# Patient Record
Sex: Female | Born: 1960 | Race: White | Hispanic: No | Marital: Married
Health system: Midwestern US, Community
[De-identification: ages and names within clinical notes are randomized; demographics above are authoritative.]

---

## 2019-12-02 ENCOUNTER — Ambulatory Visit: Attending: Orthopaedic Surgery

## 2019-12-02 ENCOUNTER — Ambulatory Visit: Admit: 2019-12-02 | Discharge: 2019-12-02 | Payer: MEDICAID | Attending: Orthopaedic Surgery

## 2019-12-02 ENCOUNTER — Inpatient Hospital Stay: Admit: 2019-12-02 | Payer: MEDICAID | Attending: Orthopaedic Surgery

## 2019-12-02 DIAGNOSIS — R202 Paresthesia of skin: Secondary | ICD-10-CM

## 2019-12-02 NOTE — Progress Notes (Signed)
New Patient Visit    Date:  12/02/19    Name: Renee Weaver  MRN: [id number]      CC:   Chief Complaint   Patient presents with   ??? Hand Pain     Rt hand and elbow pain         HPI:    CLAUDETT BAYLY is a 59 y.o. female who states that in September of 2019 will working she develops symptoms in her right upper extremity.  At the time, hand with block into a clinched fist requiring her to pull the fingers open.  She also get a dorsal radiating pain up the forearm from the wrist into the elbows.  It within spread is an ache into the shoulder and the neck.  [facility] where they felt she did not have carpal tunnel and advised that she go to [facility].   She states [facility] sent her for massage therapy.  She states that they felt her symptoms were from the cervical spine.  Of note, she has a spinal fusion in 2004 with and extention of the fusion one level in 2007.  Ultimately, she was let go from work.  She has not work since.  She has also received no treatment sent she states.  She does get occasional numbness and tingling in her 4th and 5th digits.  Occasional numbness in the index finger.  She does awaken at night when she bumps the medial aspect her elbow in gets electrical shocks.  She does have some weakness.  She denies difficulty fine motor tasks although takes or longer to button things.  She does note that she drops things if she is not careful.  The patient's symptoms have less than but of not resolved since leaving work.  She does states that she has out currently on disability due to her cervical spine period    PMH:   Past Medical History:   Diagnosis Date   ??? Arthritis    ??? Chronic obstructive pulmonary disease (HCC)    ??? Chronic pain    ??? Depression    ??? Hypertension    ??? Thyroid disease        PSH:   Past Surgical History:   Procedure Laterality Date   ??? HX CESAREAN SECTION  1991   ??? HX OTHER SURGICAL  1980's    2 oral surgeries   ??? HX WISDOM TEETH  EXTRACTION  1980's   ??? PR PLASTIC SURGERY, NECK  2004/2007    2 neck fusions       Social History:   Social History     Socioeconomic History   ??? Marital status: MARRIED     Spouse name: Not on file   ??? Number of children: Not on file   ??? Years of education: Not on file   ??? Highest education level: Not on file   Occupational History   ??? Not on file   Tobacco Use   ??? Smoking status: Current Every Day Smoker     Packs/day: 0.50     Years: 30.00     Pack years: 15.00   ??? Smokeless tobacco: Never Used   Vaping Use   ??? Vaping Use: Never used   Substance and Sexual Activity   ??? Alcohol use: Yes   ??? Drug use: Yes     Types: Marijuana     Comment: medical   ??? Sexual activity: Not on file  Other Topics Concern   ??? Not on file   Social History Narrative   ??? Not on file     Social Determinants of Health     Financial Resource Strain:    ??? Difficulty of Paying Living Expenses:    Food Insecurity:    ??? Worried About Programme researcher, broadcasting/film/video in the Last Year:    ??? Barista in the Last Year:    Transportation Needs:    ??? Freight forwarder (Medical):    ??? Lack of Transportation (Non-Medical):    Physical Activity:    ??? Days of Exercise per Week:    ??? Minutes of Exercise per Session:    Stress:    ??? Feeling of Stress :    Social Connections:    ??? Frequency of Communication with Friends and Family:    ??? Frequency of Social Gatherings with Friends and Family:    ??? Attends Religious Services:    ??? Database administrator or Organizations:    ??? Attends Engineer, structural:    ??? Marital Status:    Intimate Programme researcher, broadcasting/film/video Violence:    ??? Fear of Current or Ex-Partner:    ??? Emotionally Abused:    ??? Physically Abused:    ??? Sexually Abused:        Allergies:   Allergies   Allergen Reactions   ??? Bactrim [Sulfamethoprim] Hives   ??? Macrodantin [Nitrofurantoin Macrocrystal] Hives       Medications:   Prior to Admission medications    Medication Sig Start Date End Date Taking? Authorizing Provider   DULoxetine (CYMBALTA) 60 mg capsule  TAKE 1 CAPSULE BY MOUTH ONCE DAILY FOR 7 DAYS 10/16/19  Yes Provider, Historical   levothyroxine (SYNTHROID) 125 mcg tablet TAKE 1 2 (ONE HALF) TABLET BY MOUTH ONCE DAILY 10/16/19  Yes Provider, Historical   simvastatin (ZOCOR) 10 mg tablet TAKE 1 TABLET BY MOUTH AT BEDTIME 10/16/19  Yes Provider, Historical   Spiriva with HandiHaler 18 mcg inhalation capsule INHALE 1 PUFF BY MOUTH ONCE DAILY 11/18/19  Yes Provider, Historical   albuterol sulfate (PROAIR DIGIHALER IN) Take  by inhalation.   Yes Provider, Historical   cholecalciferol, vitamin D3, (Vitamin D3) 50 mcg (2,000 unit) tab Take  by mouth.   Yes Provider, Historical   BABY ASPIRIN PO Take 81 mg by mouth.   Yes Provider, Historical       Problem List:   Patient Active Problem List   Diagnosis Code   ??? 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 Hand Pain     Rt hand and elbow pain         PHYSICAL EXAM:  Visit Vitals  Ht 5' 3.5" (1.613 m)   Wt 110 lb (49.9 kg)   BMI 19.18 kg/m??     @BW @        Body mass index is 19.18 kg/m??.    General:  The patient is a well-appearing 59 y.o. female who appears her  stated age. she is alert and oriented x3, pleasant, conversive, interactive and appropriate.    Skin  Intact, no abrasions or open wounds.     Inspection  No swelling or atrophy the hand is noted.    Palpation  Nontender the CMC joint, 1st compartment, or A1 pulleys.  Nontender the DRUJ, ECU, TFCC, pisotriquetral joint.  Tender over the lateral epicondyle and medial epicondyle.  Nontender over the biceps or triceps.  Tender over  the ulnar nerve at the elbow.  Nontender the radial tunnel.    Vascular  The hand is warm and well-perfused. 2+ radial and ulnar pulses. Capillary refill is less than two seconds.     Sensory  Thumb and little finger 2 point discrimination are 8 mm.     Motor  Thumb adductor is 5/5 without atrophy.  First dorsal interosseous is 4/5 without atrophy.    Specialized musculoskeletal examination:  Negative Spurling.  Negative Adson.  Positive Tinel at the elbow but negative elbow flexion test.  No subluxation of the ulnar nerve at the elbow.  Negative Tinel over the brachial plexus negative Tinel over the medial arm.  Negative Tinel over the pronator.  Compression of the pronator produces no paresthesias.  Negative Tinel over Guyon's but compression over Guyon's produces some paresthesias in the little finger.  Negative Tinel at the wrist but a positive median nerve compression test which produces paresthesias in the thumb index and middle finger.  Full elbow flexion and full elbow extension.  Full forearm pronation full forearm supination.  No pain with resisted wrist extension, resisted forearm supination, or resisted forearm pronation.  No clicking or locking flexion-extension digits.  Negative CMC grind.  Negative Finkelstein.  Compression rotation DRUJ produces no pain.  Her ECU is stable.  Negative TFCC grind  ______________________________________________________________________    IMAGING:  X-ray examination includes two views of the right elbow, including AP and lateral views, obtained today, December 02, 2019, at [facility].  These reveal a spur at the origin of the ECRB tendon laterally.    X-ray examination includes three views of the right wrist, including AP, lateral, and carpal tunnel views, obtained today, December 02, 2019, at [facility].  These reveal joint space narrowing of the [facility] and STT joints.  There is ulnar positivity with abutment of the ulnar head on the  lunate.  There is some cystic changes and sclerosis within the lunate.  _____________________________________________________________________    IMPRESSION:  Encounter Diagnoses     ICD-10-CM ICD-9-CM   1. Paresthesias in right hand  R20.2 782.0   2. Carpal tunnel syndrome on right  G56.01 354.0   3. Cubital tunnel syndrome on right  G56.21 354.2   4. Medial epicondylitis, right elbow  M77.01 726.31   5. Lateral epicondylitis of right elbow  M77.11 726.32        PLAN:  Today we discussed the diagnosis and treatment options.  The patient's primary problem does not appear to be her neck today.  She does have some evidence of irritation median nerve in the ulnar nerve.  Additionally, she has some evidence of some mild medial and lateral epicondylitis.  At this point, I would like to proceed with an EMG to rule out cubital tunnel syndrome as well as carpal tunnel syndrome.  Will see her back after the EMG.    Based on her oral history of her symptoms beginning at work, did not improve with treatment or time off from work,  and she has been working nor else, her current symptoms are related to her initial work injury.      FOLLOW-UP:  After EMG.    IMAGING NEXT VISIT: no

## 2019-12-04 IMAGING — MR MRI RIGHT SHOULDER WITHOUT CONTRAST
5 of 7 series · 24 of 40 positions shown · IV contrast (gadolinium)
Comparison: None.

MRI RIGHT SHOULDER WITHOUT CONTRAST, 12/04/2019 [DATE]: 
CLINICAL INDICATION: Primary osteoarthritis. Right shoulder pain and 
impingement. Short-term relief with cortisone injection.
TECHNIQUE: Multiplanar, multiecho position MR images of the right shoulder were 
performed without intravenous gadolinium enhancement.

[Series 101: survey_fullfov_transversal · axial · 10.0mm · 1.84mm/px · z∈[-82,+82]mm · 2 of 12 slices shown]
[im 1/12]
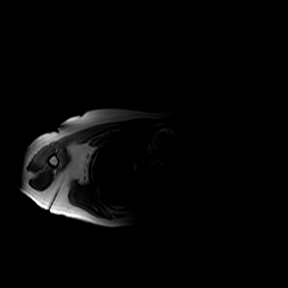
[im 12/12]
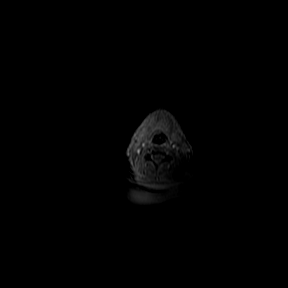

[Series 201: survey_right · axial · 10.0mm · 0.99mm/px · z∈[-40,+175]mm · 3 of 15 slices shown]
[im 1/15]
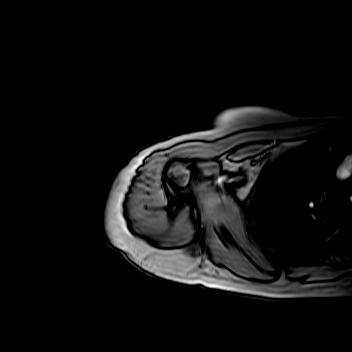
[im 8/15]
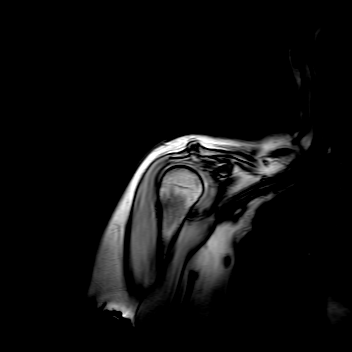
[im 15/15]
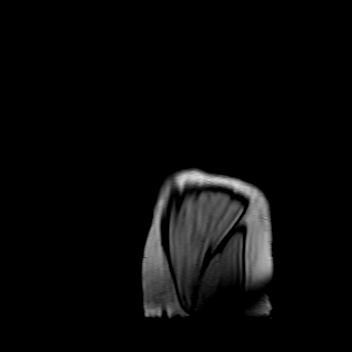

[Series 301: pdw spair ax · axial · 2.2mm · 0.27mm/px · z∈[-39,+53]mm · 9 of 36 slices shown]
[im 1/36]
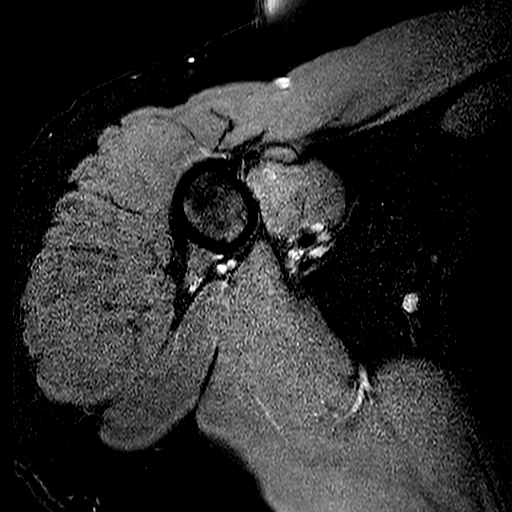
[im 5/36]
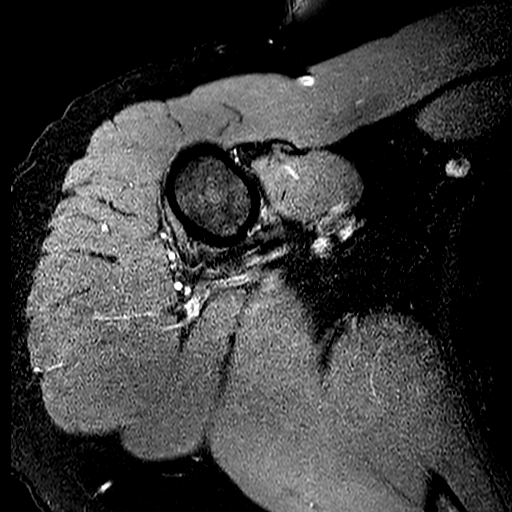
[im 9/36]
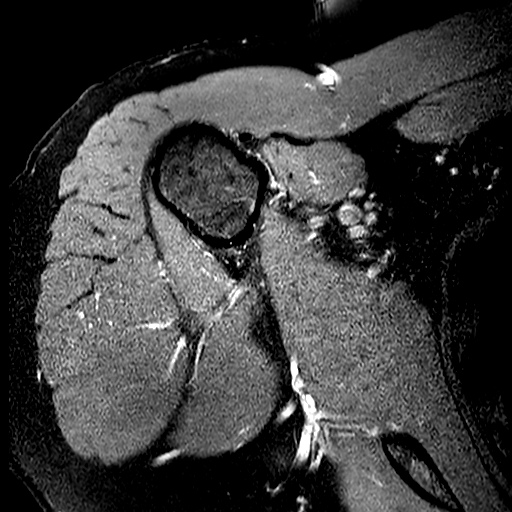
[im 14/36]
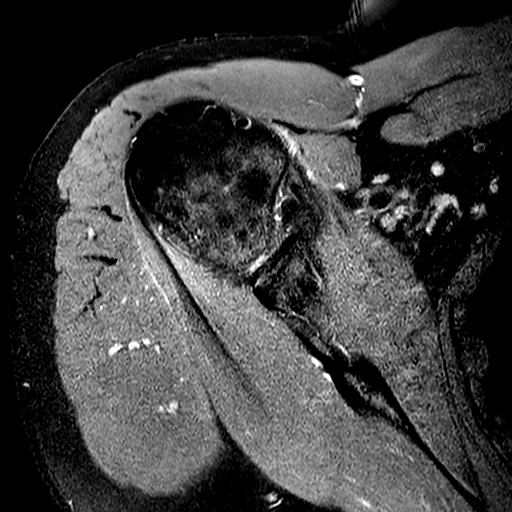
[im 18/36]
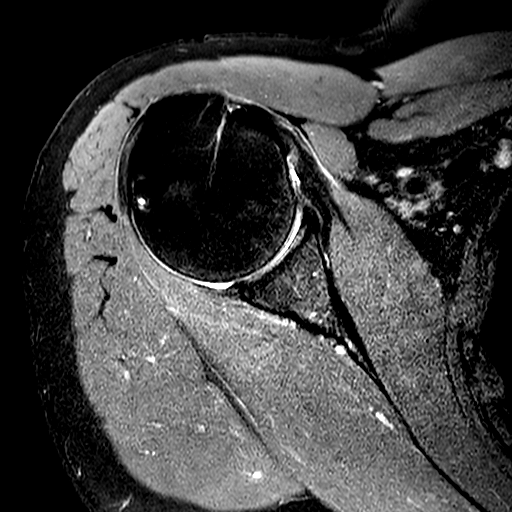
[im 22/36]
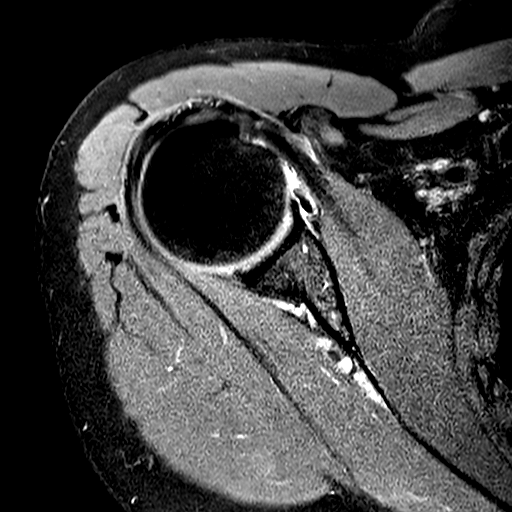
[im 27/36]
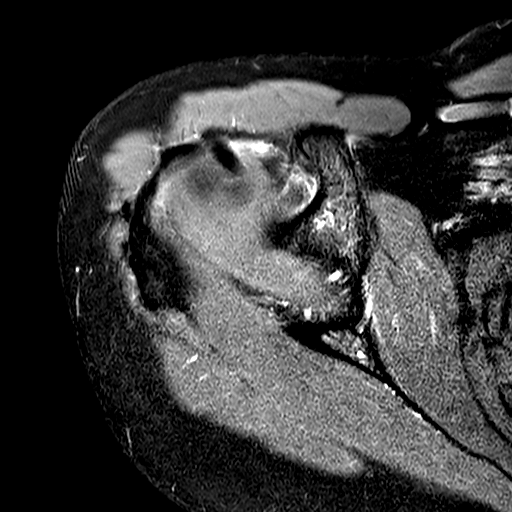
[im 31/36]
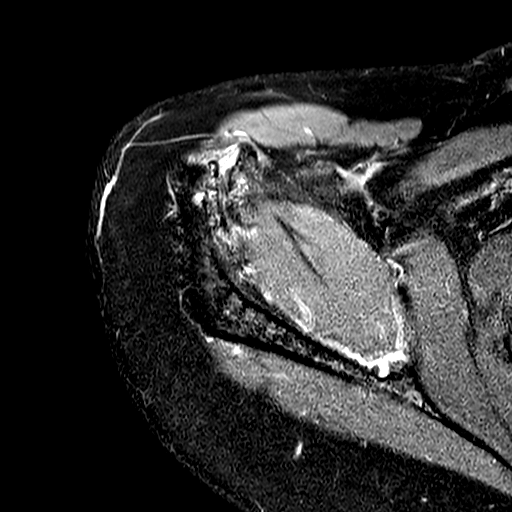
[im 36/36]
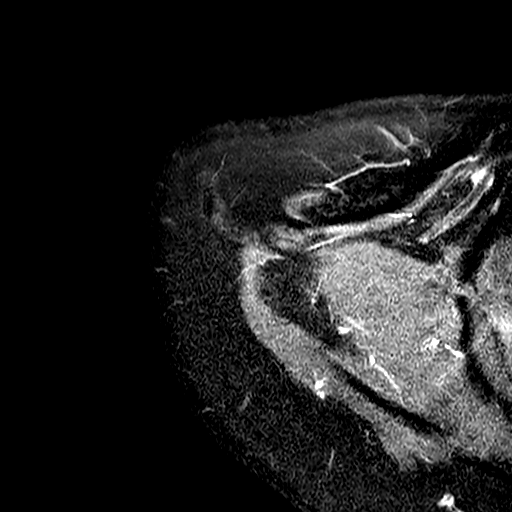

[Series 401: t2w spair sag · oblique · 3.2mm · 0.39mm/px · 7 of 28 slices shown]
[im 1/28]
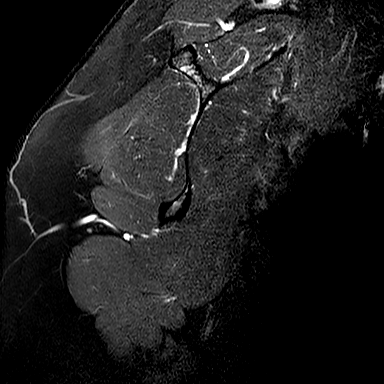
[im 5/28]
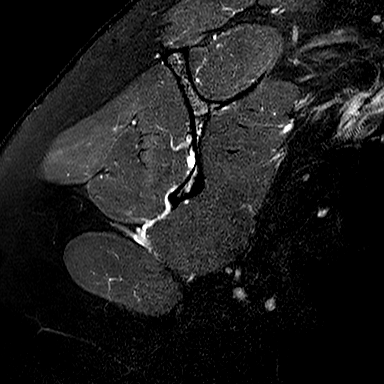
[im 10/28]
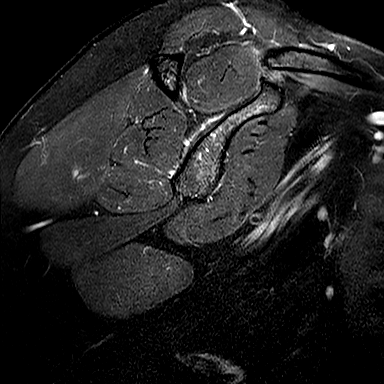
[im 14/28]
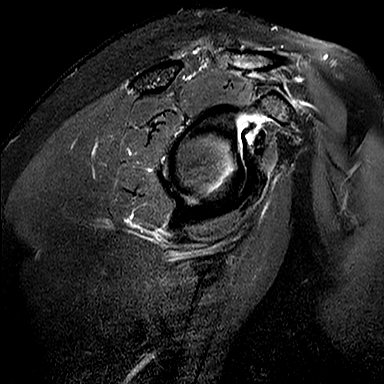
[im 19/28]
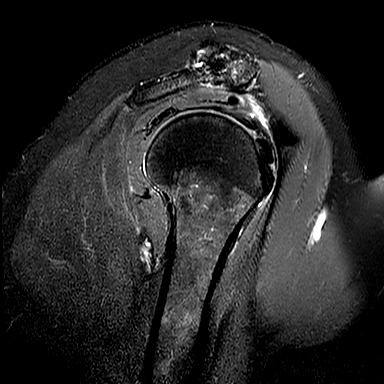
[im 23/28]
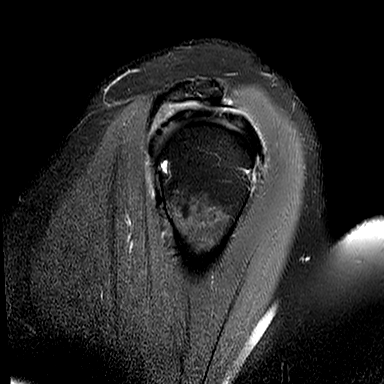
[im 28/28]
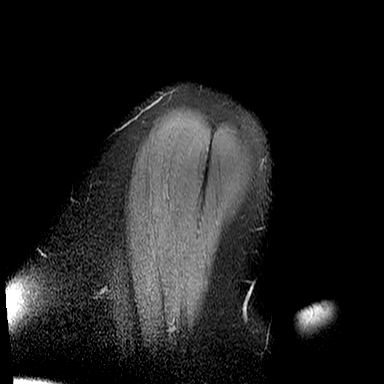

[Series 601: t1w tse sag · oblique · 3.2mm · 0.29mm/px · 3 of 28 slices shown]
[im 1/28]
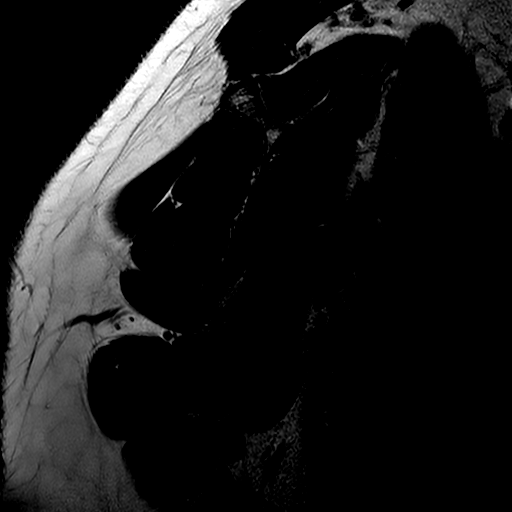
[im 5/28]
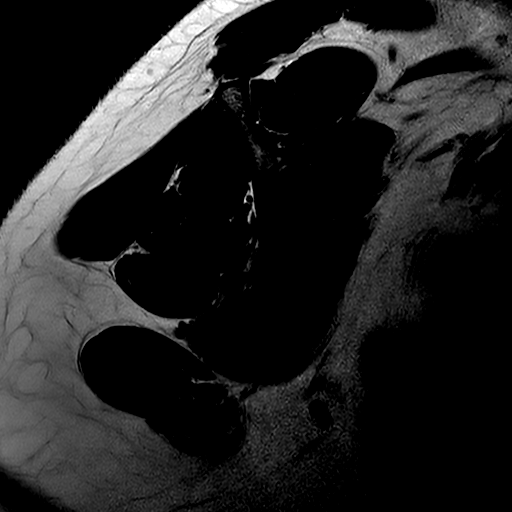
[im 10/28]
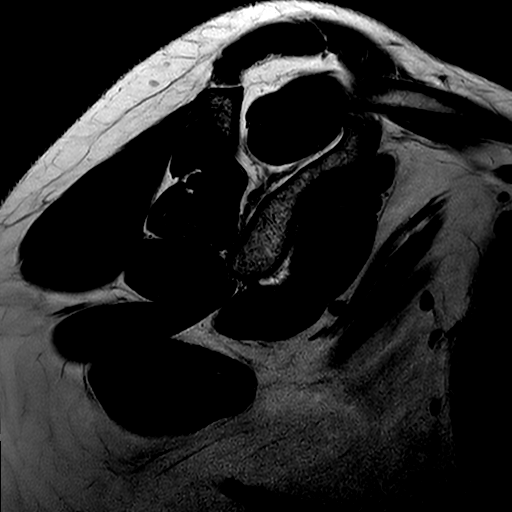

[24 of 40 positions shown; findings below may reference images not displayed]

FINDINGS: ROTATOR CUFF: The supraspinatus and infraspinatus tendons are intact. No 
high-grade partial thickness or full-thickness rotator cuff tear. The 
subscapularis and teres minor tendons are preserved. The rotator cuff 
musculature is symmetric without mass, signal abnormality or atrophy. 
ACROMIOCLAVICULAR JOINT: Moderate to severe AC joint degenerative change with 
focal effacement of the subacromial fat. The coracoacromial ligament is intact 
without prominent spurring at the acromial attachment. The acromioclavicular and 
coracoclavicular ligaments are preserved. The acromion is normal in morphology. 
GLENOHUMERAL JOINT: The humeral head is well located within the glenoid fossa. 
The glenoid labrum is preserved. No paralabral cyst. The intra-articular portion 
of the long head of the biceps tendon is negative. No shoulder joint effusion. 
BONES AND SOFT TISSUES: The bone marrow signal intensity is negative for 
fracture. No Hill-Sachs defect. Small posterolateral humeral head subcortical 
cysts. Trace amount of fluid in the subacromial/subdeltoid bursa. The axillary 
region is negative. Subcutaneous tissues are negative.
IMPRESSION: 1.  Moderate to severe AC joint degenerative change with focal effacement of the 
subacromial fat and trace amount of bursal fluid. 
2.  Small posterolateral humeral head subcortical cysts. 
3.  Otherwise, unremarkable.

## 2020-02-03 ENCOUNTER — Ambulatory Visit: Attending: Orthopaedic Surgery

## 2020-02-03 ENCOUNTER — Ambulatory Visit: Admit: 2020-02-03 | Discharge: 2020-02-03 | Payer: MEDICAID | Attending: Orthopaedic Surgery

## 2020-02-03 DIAGNOSIS — M7701 Medial epicondylitis, right elbow: Secondary | ICD-10-CM

## 2020-02-03 NOTE — Progress Notes (Signed)
FOLLOW-UP VISIT  ______________________________________________________________________    Name: Renee Weaver  DOB: 1960-06-19  Today's Date: 02/03/2020  Last Visit: 12/02/2019    Chief Complaint/Diagnosis:  Chief Complaint   Patient presents with   ??? Wrist Pain     Follow up EMG   ??? Follow-up     ______________________________________________________________________    HPI:  Renee Weaver is a very pleasant 59 y.o. female who presents with the above listed complaint.  Patient returns having the EMG as requested.  The EMG was negative for obvious compressive neuropathy.  We reviewed that there could still be irritation of the nerves without frank compression causing damage, which is what the nerve test would detect.  She still having mild lateral and more intense medial-sided elbow pain.  She is still getting some paresthesias in the hand.  She notes tenderness along the cubital tunnel.  ______________________________________________________________________    PMHx:   Past Medical History:   Diagnosis Date   ??? Arthritis    ??? Chronic obstructive pulmonary disease (HCC)    ??? Chronic pain    ??? Depression    ??? Hypertension    ??? Thyroid disease        PSgHx:   Past Surgical History:   Procedure Laterality Date   ??? HX CESAREAN SECTION  1991   ??? HX OTHER SURGICAL  1980's    2 oral surgeries   ??? HX WISDOM TEETH EXTRACTION  1980's   ??? PR PLASTIC SURGERY, NECK  2004/2007    2 neck fusions       ALLERGIES:   Allergies   Allergen Reactions   ??? Bactrim [Sulfamethoprim] Hives   ??? Macrodantin [Nitrofurantoin Macrocrystal] Hives       MEDS:   Current Outpatient Medications:   ???  citalopram (CELEXA) 40 mg tablet, citalopram 40 mg tablet, Disp: , Rfl:   ???  albuterol (ProAir HFA) 90 mcg/actuation inhaler, ProAir HFA 90 mcg/actuation aerosol inhaler, Disp: , Rfl:   ???  DULoxetine (CYMBALTA) 60 mg capsule, TAKE 1 CAPSULE BY MOUTH ONCE DAILY FOR 7 DAYS, Disp: , Rfl:   ???  levothyroxine (SYNTHROID) 125 mcg tablet, TAKE 1 2 (ONE HALF)  TABLET BY MOUTH ONCE DAILY, Disp: , Rfl:   ???  simvastatin (ZOCOR) 10 mg tablet, TAKE 1 TABLET BY MOUTH AT BEDTIME, Disp: , Rfl:   ???  Spiriva with HandiHaler 18 mcg inhalation capsule, INHALE 1 PUFF BY MOUTH ONCE DAILY, Disp: , Rfl:   ???  albuterol sulfate (PROAIR DIGIHALER IN), Take  by inhalation., Disp: , Rfl:   ???  cholecalciferol, vitamin D3, (Vitamin D3) 50 mcg (2,000 unit) tab, Take  by mouth., Disp: , Rfl:   ???  BABY ASPIRIN PO, Take 81 mg by mouth., Disp: , Rfl:     SHx:   Social History     Socioeconomic History   ??? Marital status: MARRIED     Spouse name: Not on file   ??? Number of children: Not on file   ??? Years of education: Not on file   ??? Highest education level: Not on file   Occupational History   ??? Not on file   Tobacco Use   ??? Smoking status: Current Every Day Smoker     Packs/day: 0.50     Years: 30.00     Pack years: 15.00   ??? Smokeless tobacco: Never Used   Vaping Use   ??? Vaping Use: Never used   Substance and Sexual Activity   ???  Alcohol use: Yes   ??? Drug use: Yes     Types: Marijuana     Comment: medical   ??? Sexual activity: Not on file   Other Topics Concern   ??? Not on file   Social History Narrative   ??? Not on file     Social Determinants of Health     Financial Resource Strain:    ??? Difficulty of Paying Living Expenses:    Food Insecurity:    ??? Worried About Programme researcher, broadcasting/film/video in the Last Year:    ??? Barista in the Last Year:    Transportation Needs:    ??? Freight forwarder (Medical):    ??? Lack of Transportation (Non-Medical):    Physical Activity:    ??? Days of Exercise per Week:    ??? Minutes of Exercise per Session:    Stress:    ??? Feeling of Stress :    Social Connections:    ??? Frequency of Communication with Friends and Family:    ??? Frequency of Social Gatherings with Friends and Family:    ??? Attends Religious Services:    ??? Database administrator or Organizations:    ??? Attends Engineer, structural:    ??? Marital Status:    Intimate Programme researcher, broadcasting/film/video Violence:    ??? Fear of Current  or Ex-Partner:    ??? Emotionally Abused:    ??? Physically Abused:    ??? Sexually Abused:        FHx:   Family History   Problem Relation Age of Onset   ??? Dementia Mother    ??? Cancer Brother    ??? Alcohol abuse Maternal Uncle    ??? Cancer Maternal Grandmother    ??? Hypertension Maternal Grandfather    ??? Cancer Paternal Grandmother      ______________________________________________________________________    ROS  General: no fevers, night sweats, unexplained weight loss  Psychiatric: no suicidal ideation  Neurologic: no headaches, loss of consciousness or change in vision  Cardiovascular: no chest pain  Respiratory: no shortness of breath  GI: no abdominal pain, no ulcers  Skin: no infections, no rashes  Hematologic: no bleeding diatheses  Musckuloskeletal:   Chief Complaint   Patient presents with   ??? Wrist Pain     Follow up EMG   ??? Follow-up     ______________________________________________________________________    PHYSICAL EXAM:  Visit Vitals  BP (!) 169/114 (BP 1 Location: Right upper arm, BP Patient Position: Sitting, BP Cuff Size: Adult)   Pulse 81   Temp 97.6 ??F (36.4 ??C)   Ht 5' 3.5" (1.613 m)   Wt 110 lb (49.9 kg)   BMI 19.18 kg/m??     @PATIENTWT @  Body mass index is 19.18 kg/m??.    General  The patient is a well-appearing 59 y.o. female who appears her stated age. she is alert and oriented x3, pleasant, conversive, interactive and appropriate.    Skin  Intact, no abrasions or open wounds.     Inspection  No swelling of the arm is noted.    Palpation  Minimally tender lateral epicondyle.  Nontender over the biceps, triceps, or radial tunnel.  Tender over the medial epicondyle.  She is tender over the ulnar nerve within the groove.    Vascular  The hand is warm and well-perfused. 2+ radial and ulnar pulses. Capillary refill is brisk to all digits.     Sensory  Sensation is intact to  light touch in all nerve distributions.     Motor  5/5 strength throughout.     Specialized musculoskeletal examination:  Positive  Tinel at the elbow but negative elbow flexion test.  Negative Tinel at the wrist.  Median nerve compression test produces a ???strange feeling.???  No pain with resisted wrist extension or resisted forearm supination.  Some pain with resisted forearm pronation.  ______________________________________________________________________  Nerve conduction study:  Negative for compressive neuropathy.  ______________________________________________________________________    IMPRESSION:  Encounter Diagnoses     ICD-10-CM ICD-9-CM   1. Medial epicondylitis, right elbow  M77.01 726.31   2. Lateral epicondylitis of right elbow  M77.11 726.32   3. 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  Will have her attend therapy for the medial lateral epicondylitis as well as the ulnar neuritis.  I have given her a sheet on ulnar nerve glides, so that she can start them today.  Will see her back in 6 weeks after hopefully 4 weeks of therapy.  If not improving with therapy, we may need E consideration to a corticosteroid injection into the more symptomatic medial condyle.  This will hopefully also help with the ulnar neuritis.  _    FOLLOW-UP: in 6 week(s)    IMAGING NEXT VISIT: no  _____________________________________________________________________  1:25 PM, 02/03/2020

## 2020-03-15 ENCOUNTER — Ambulatory Visit: Attending: Orthopaedic Surgery

## 2020-03-15 ENCOUNTER — Ambulatory Visit: Admit: 2020-03-15 | Discharge: 2020-03-15 | Payer: MEDICAID | Attending: Orthopaedic Surgery

## 2020-03-15 DIAGNOSIS — M7701 Medial epicondylitis, right elbow: Secondary | ICD-10-CM

## 2020-03-15 NOTE — Progress Notes (Signed)
FOLLOW-UP VISIT  ______________________________________________________________________    Name: Renee Weaver  DOB: Aug 02, 1960  Today's Date: 03/15/2020  Last Visit: 02/03/2020    Chief Complaint/Diagnosis:  Chief Complaint   Patient presents with   ??? Hand Pain     6 wk follow up Rt hand/elbow     ______________________________________________________________________    HPI:  Renee Weaver is a very pleasant 59 y.o. female who presents with the above listed complaint.  Patient returns to [place] having had some therapy for her right medial and lateral epicondylitis.  She notes improvement in her symptoms but not complete resolution.  She has no longer having paresthesias.  She states she has a hearing in the near future with regards to her workman's comp status.  She also caring for her mother who has dementia and her father who has cancer period  ______________________________________________________________________    PMHx:   Past Medical History:   Diagnosis Date   ??? Arthritis    ??? Chronic obstructive pulmonary disease (HCC)    ??? Chronic pain    ??? Depression    ??? Hypertension    ??? Thyroid disease        PSgHx:   Past Surgical History:   Procedure Laterality Date   ??? HX CESAREAN SECTION  1991   ??? HX OTHER SURGICAL  1980's    2 oral surgeries   ??? HX WISDOM TEETH EXTRACTION  1980's   ??? PR PLASTIC SURGERY, NECK  2004/2007    2 neck fusions       ALLERGIES:   Allergies   Allergen Reactions   ??? Bactrim [Sulfamethoprim] Hives   ??? Macrodantin [Nitrofurantoin Macrocrystal] Hives       MEDS:   Current Outpatient Medications:   ???  lisinopriL (PRINIVIL, ZESTRIL) 5 mg tablet, Take 5 mg by mouth daily., Disp: , Rfl:   ???  citalopram (CELEXA) 40 mg tablet, citalopram 40 mg tablet, Disp: , Rfl:   ???  albuterol (ProAir HFA) 90 mcg/actuation inhaler, ProAir HFA 90 mcg/actuation aerosol inhaler, Disp: , Rfl:   ???  DULoxetine (CYMBALTA) 60 mg capsule, TAKE 1 CAPSULE BY MOUTH ONCE DAILY FOR 7 DAYS, Disp: , Rfl:   ???  levothyroxine  (SYNTHROID) 125 mcg tablet, TAKE 1 2 (ONE HALF) TABLET BY MOUTH ONCE DAILY, Disp: , Rfl:   ???  simvastatin (ZOCOR) 10 mg tablet, TAKE 1 TABLET BY MOUTH AT BEDTIME, Disp: , Rfl:   ???  Spiriva with HandiHaler 18 mcg inhalation capsule, INHALE 1 PUFF BY MOUTH ONCE DAILY, Disp: , Rfl:   ???  albuterol sulfate (PROAIR DIGIHALER IN), Take  by inhalation., Disp: , Rfl:   ???  cholecalciferol, vitamin D3, (Vitamin D3) 50 mcg (2,000 unit) tab, Take  by mouth., Disp: , Rfl:   ???  BABY ASPIRIN PO, Take 81 mg by mouth., Disp: , Rfl:     SHx:   Social History     Socioeconomic History   ??? Marital status: MARRIED     Spouse name: Not on file   ??? Number of children: Not on file   ??? Years of education: Not on file   ??? Highest education level: Not on file   Occupational History   ??? Not on file   Tobacco Use   ??? Smoking status: Current Every Day Smoker     Packs/day: 0.50     Years: 30.00     Pack years: 15.00   ??? Smokeless tobacco: Never Used   Vaping Use   ???  Vaping Use: Never used   Substance and Sexual Activity   ??? Alcohol use: Yes   ??? Drug use: Yes     Types: Marijuana     Comment: medical   ??? Sexual activity: Not on file   Other Topics Concern   ??? Not on file   Social History Narrative   ??? Not on file     Social Determinants of Health     Financial Resource Strain:    ??? Difficulty of Paying Living Expenses:    Food Insecurity:    ??? Worried About Programme researcher, broadcasting/film/video in the Last Year:    ??? Barista in the Last Year:    Transportation Needs:    ??? Freight forwarder (Medical):    ??? Lack of Transportation (Non-Medical):    Physical Activity:    ??? Days of Exercise per Week:    ??? Minutes of Exercise per Session:    Stress:    ??? Feeling of Stress :    Social Connections:    ??? Frequency of Communication with Friends and Family:    ??? Frequency of Social Gatherings with Friends and Family:    ??? Attends Religious Services:    ??? Database administrator or Organizations:    ??? Attends Engineer, structural:    ??? Marital Status:     Intimate Programme researcher, broadcasting/film/video Violence:    ??? Fear of Current or Ex-Partner:    ??? Emotionally Abused:    ??? Physically Abused:    ??? Sexually Abused:        FHx:   Family History   Problem Relation Age of Onset   ??? Dementia Mother    ??? Cancer Brother    ??? Alcohol abuse Maternal Uncle    ??? Cancer Maternal Grandmother    ??? Hypertension Maternal Grandfather    ??? Cancer Paternal Grandmother      ______________________________________________________________________    ROS  General: no fevers, night sweats, unexplained weight loss  Psychiatric: no suicidal ideation  Neurologic: no headaches, loss of consciousness or change in vision  Cardiovascular: no chest pain  Respiratory: no shortness of breath  GI: no abdominal pain, no ulcers  Skin: no infections, no rashes  Hematologic: no bleeding diatheses  Musckuloskeletal:   Chief Complaint   Patient presents with   ??? Hand Pain     6 wk follow up Rt hand/elbow     ______________________________________________________________________    PHYSICAL EXAM:  Visit Vitals  BP (!) 146/99   Pulse 78   Temp 98.6 ??F (37 ??C)   Ht 5' 3.5" (1.613 m)   Wt 110 lb (49.9 kg)   BMI 19.18 kg/m??     @PATIENTWT @  Body mass index is 19.18 kg/m??.    General  The patient is a well-appearing 59 y.o. female who appears her stated age. she is alert and oriented x3, pleasant, conversive, interactive and appropriate.    Skin  Intact, no abrasions or open wounds.     Inspection  No significant swelling the elbow is noted.    Palpation  Focally tender over the medial and lateral epicondyle.  Diffusely tender over the dorsal radial musculature.  Nontender the biceps or triceps.  Nontender the ulnar nerve.    Vascular  The hand is warm and well-perfused. 2+ radial and ulnar pulses. Capillary refill is brisk to all digits.     Sensory  Little finger 2 point discrimination is 6 mm.  Motor  First dorsal osseous is 5/5 without atrophy.    Specialized musculoskeletal examination:  Negative Tinel at the elbow and negative  elbow flexion test.  She has full elbow flexion and full elbow extension.  She has full forearm pronation full forearm supination.  No pain with resisted wrist extension over the lateral epicondyle, no pain with resisted forearm supination over the radial tunnel, and no pain with resisted forearm pronation over will epicondyle period  ______________________________________________________________________    IMPRESSION:  Encounter Diagnoses     ICD-10-CM ICD-9-CM   1. Medial epicondylitis, right elbow  M77.01 726.31   2. Lateral epicondylitis of right elbow  M77.11 726.32     ______________________________________________________________________    PLAN:  Her cubital tunnel syndrome has resolved.  We discussed continuing the current course.  She is having improvement overall in her medial lateral epicondyle pain period we discussed possible corticosteroid injections, possible PRP injection, possible Tenex procedure, and possible open debridement.  Will see her back after completion of her therapy for repeat clinical examination.  _    FOLLOW-UP: in 6 week(s)    IMAGING NEXT VISIT: no  _____________________________________________________________________  10:51 AM, 03/15/2020

## 2020-04-26 ENCOUNTER — Ambulatory Visit: Attending: Orthopaedic Surgery

## 2020-04-26 ENCOUNTER — Ambulatory Visit: Admit: 2020-04-26 | Discharge: 2020-04-26 | Payer: MEDICAID | Attending: Orthopaedic Surgery

## 2020-04-26 DIAGNOSIS — M7701 Medial epicondylitis, right elbow: Secondary | ICD-10-CM

## 2020-04-26 NOTE — Progress Notes (Signed)
FOLLOW-UP VISIT  ______________________________________________________________________    Name: Renee Weaver  DOB: Jun 24, 1960  Today's Date: 04/26/2020  Last Visit: 03/15/2020    Chief Complaint/Diagnosis:  Chief Complaint   Patient presents with   ??? Hand Pain      6 wk f/u Right hand/elbow pain      ______________________________________________________________________    HPI:  Renee Weaver is a very pleasant 59 y.o. female who presents with the above listed complaint.  The patient attended therapy as requested and has noted improvement in her elbow symptoms.  She is not currently having paresthesias in the hand.  She did have of meeting and has to have an additional median from a judge with respect to whether not this is a work related issue.  ______________________________________________________________________    PMHx:   Past Medical History:   Diagnosis Date   ??? Arthritis    ??? Chronic obstructive pulmonary disease (HCC)    ??? Chronic pain    ??? Depression    ??? Hypertension    ??? Thyroid disease        PSgHx:   Past Surgical History:   Procedure Laterality Date   ??? HX CESAREAN SECTION  1991   ??? HX OTHER SURGICAL  1980's    2 oral surgeries   ??? HX WISDOM TEETH EXTRACTION  1980's   ??? PR PLASTIC SURGERY, NECK  2004/2007    2 neck fusions       ALLERGIES:   Allergies   Allergen Reactions   ??? Bactrim [Sulfamethoprim] Hives   ??? Macrodantin [Nitrofurantoin Macrocrystal] Hives       MEDS:   Current Outpatient Medications:   ???  lisinopriL (PRINIVIL, ZESTRIL) 5 mg tablet, Take 5 mg by mouth daily., Disp: , Rfl:   ???  citalopram (CELEXA) 40 mg tablet, citalopram 40 mg tablet, Disp: , Rfl:   ???  albuterol (ProAir HFA) 90 mcg/actuation inhaler, ProAir HFA 90 mcg/actuation aerosol inhaler, Disp: , Rfl:   ???  DULoxetine (CYMBALTA) 60 mg capsule, TAKE 1 CAPSULE BY MOUTH ONCE DAILY FOR 7 DAYS, Disp: , Rfl:   ???  levothyroxine (SYNTHROID) 125 mcg tablet, TAKE 1 2 (ONE HALF) TABLET BY MOUTH ONCE DAILY, Disp: , Rfl:   ???   simvastatin (ZOCOR) 10 mg tablet, TAKE 1 TABLET BY MOUTH AT BEDTIME, Disp: , Rfl:   ???  Spiriva with HandiHaler 18 mcg inhalation capsule, INHALE 1 PUFF BY MOUTH ONCE DAILY, Disp: , Rfl:   ???  albuterol sulfate (PROAIR DIGIHALER IN), Take  by inhalation., Disp: , Rfl:   ???  cholecalciferol, vitamin D3, (Vitamin D3) 50 mcg (2,000 unit) tab, Take  by mouth., Disp: , Rfl:   ???  BABY ASPIRIN PO, Take 81 mg by mouth., Disp: , Rfl:     SHx:   Social History     Socioeconomic History   ??? Marital status: MARRIED     Spouse name: Not on file   ??? Number of children: Not on file   ??? Years of education: Not on file   ??? Highest education level: Not on file   Occupational History   ??? Not on file   Tobacco Use   ??? Smoking status: Current Every Day Smoker     Packs/day: 0.50     Years: 30.00     Pack years: 15.00   ??? Smokeless tobacco: Never Used   Vaping Use   ??? Vaping Use: Never used   Substance and Sexual Activity   ???  Alcohol use: Yes   ??? Drug use: Yes     Types: Marijuana     Comment: medical   ??? Sexual activity: Not on file   Other Topics Concern   ??? Not on file   Social History Narrative   ??? Not on file     Social Determinants of Health     Financial Resource Strain:    ??? Difficulty of Paying Living Expenses: Not on file   Food Insecurity:    ??? Worried About Running Out of Food in the Last Year: Not on file   ??? Ran Out of Food in the Last Year: Not on file   Transportation Needs:    ??? Lack of Transportation (Medical): Not on file   ??? Lack of Transportation (Non-Medical): Not on file   Physical Activity:    ??? Days of Exercise per Week: Not on file   ??? Minutes of Exercise per Session: Not on file   Stress:    ??? Feeling of Stress : Not on file   Social Connections:    ??? Frequency of Communication with Friends and Family: Not on file   ??? Frequency of Social Gatherings with Friends and Family: Not on file   ??? Attends Religious Services: Not on file   ??? Active Member of Clubs or Organizations: Not on file   ??? Attends Tax inspector Meetings: Not on file   ??? Marital Status: Not on file   Intimate Partner Violence:    ??? Fear of Current or Ex-Partner: Not on file   ??? Emotionally Abused: Not on file   ??? Physically Abused: Not on file   ??? Sexually Abused: Not on file   Housing Stability:    ??? Unable to Pay for Housing in the Last Year: Not on file   ??? Number of Places Lived in the Last Year: Not on file   ??? Unstable Housing in the Last Year: Not on file       FHx:   Family History   Problem Relation Age of Onset   ??? Dementia Mother    ??? Cancer Brother    ??? Alcohol abuse Maternal Uncle    ??? Cancer Maternal Grandmother    ??? Hypertension Maternal Grandfather    ??? Cancer Paternal Grandmother      ______________________________________________________________________    ROS  General: no fevers, night sweats, unexplained weight loss  Psychiatric: no suicidal ideation  Neurologic: no headaches, loss of consciousness or change in vision  Cardiovascular: no chest pain  Respiratory: no shortness of breath  GI: no abdominal pain, no ulcers  Skin: no infections, no rashes  Hematologic: no bleeding diatheses  Musckuloskeletal:   Chief Complaint   Patient presents with   ??? Hand Pain      6 wk f/u Right hand/elbow pain      ______________________________________________________________________    PHYSICAL EXAM:  Visit Vitals  Temp 98.3 ??F (36.8 ??C)   Ht 5' 3.5" (1.613 m)   Wt 110 lb (49.9 kg)   BMI 19.18 kg/m??     @PATIENTWT @  Body mass index is 19.18 kg/m??.    General  The patient is a well-appearing 59 y.o. female who appears her stated age. she is alert and oriented x3, pleasant, conversive, interactive and appropriate.    Skin  Intact, no abrasions or open wounds.     Inspection  No elbow swelling is noted.    Palpation  Tender over the ECU origin  at the lateral epicondyle.  Mildly tender over the dorsal radial musculature.  Tender the medial epicondyle.  Tender along the course of the ulnar nerve.  Nontender the triceps or  biceps.    Vascular  The hand is warm and well-perfused. 2+ radial and ulnar pulses. Capillary refill is brisk to all digits.     Sensory  Little finger 2 point discrimination is 6 mm.     Motor  First dorsal osseous is 5/5 without atrophy.    Specialized musculoskeletal examination:  Full elbow flexion and full elbow extension.  Full forearm pronation full forearm supination.  No pain with resisted wrist extension.  No pain with resisted forearm supination.  Pain over the medial epicondyle with resisted forearm pronation.  ______________________________________________________________________  IMPRESSION:  Encounter Diagnoses     ICD-10-CM ICD-9-CM   1. Medial epicondylitis, right elbow  M77.01 726.31   2. Lateral epicondylitis of right elbow  M77.11 726.32     ______________________________________________________________________    PLAN:  As she has improved will continue her home exercise program.  If she has a flare she will contact us.  Otherwise she will come back in for evaluation once she has determine if this is work related or not.  _    FOLLOW-UP: prn    IMAGING NEXT VISIT: no  _____________________________________________________________________  12:28 PM, 04/26/2020

## 2021-09-26 IMAGING — MG MAMMOGRAPHY SCREENING BILATERAL 3[PERSON_NAME]
8 series · 8 of 24 positions shown · non-contrast
Comparison: Comparison was made to prior examinations.

________________________________________________________________________________________________ 
MAMMOGRAPHY SCREENING BILATERAL 3MAMIKO KHANDURI, 09/26/2021 [DATE]: 
CLINICAL INDICATION: Screening.
TECHNIQUE: Digital bilateral mammograms and 3-D Tomosynthesis were obtained. 
These were interpreted both primarily and with the aid of computer-aided 
detection system.  
BREAST DENSITY: (Level C) The breasts are heterogeneously dense, which may 
obscure small masses.

[L CC]
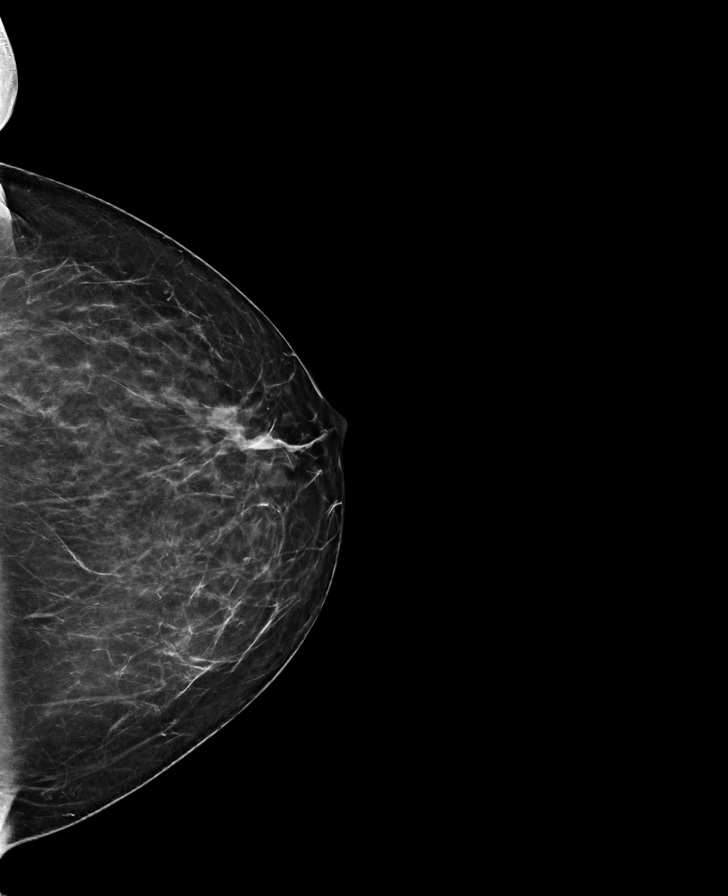

[L MLO]
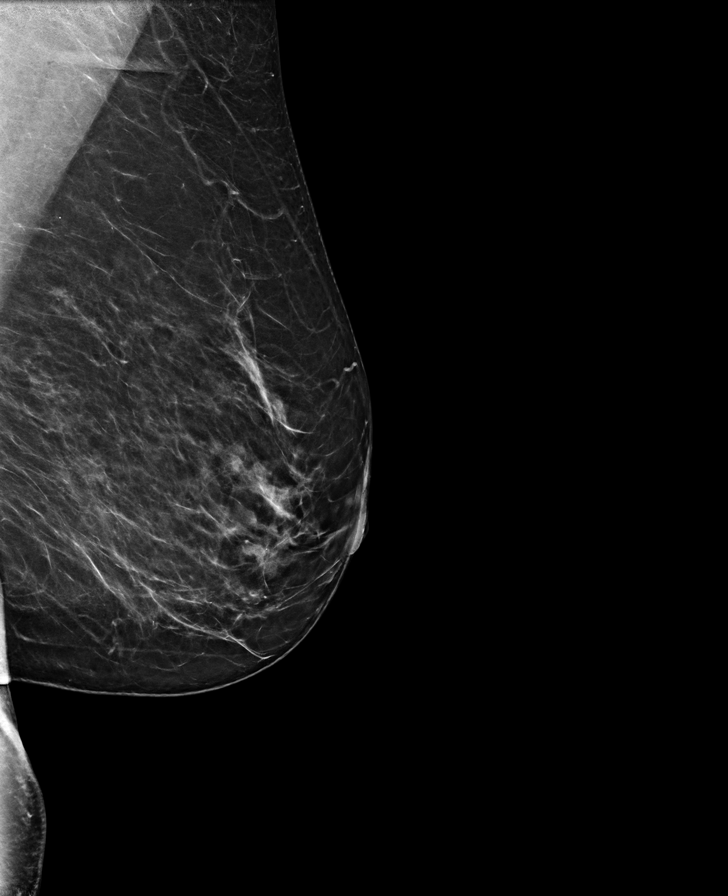

[R MLO]
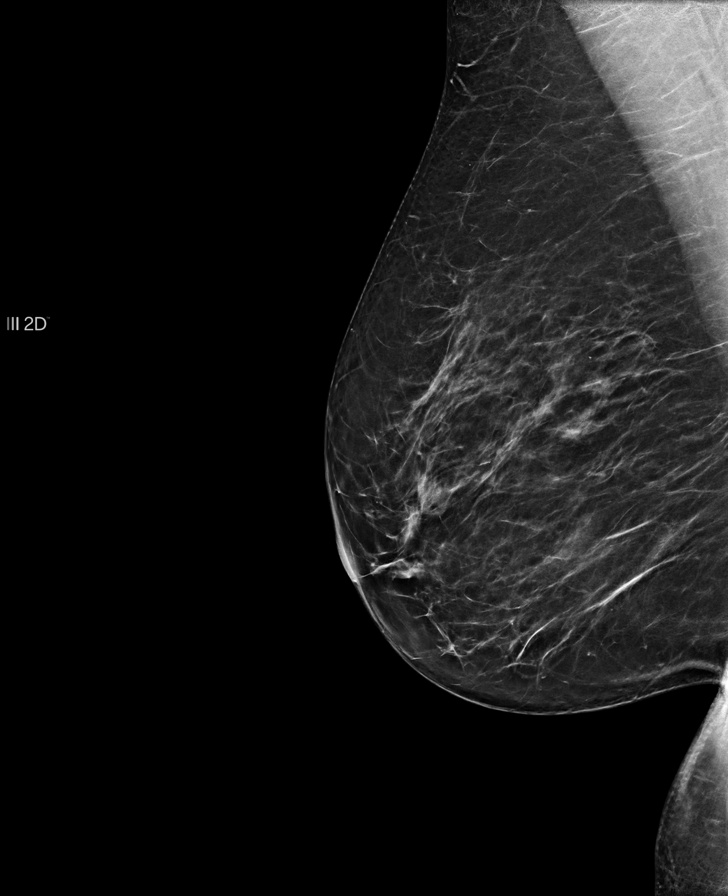

[R CC]
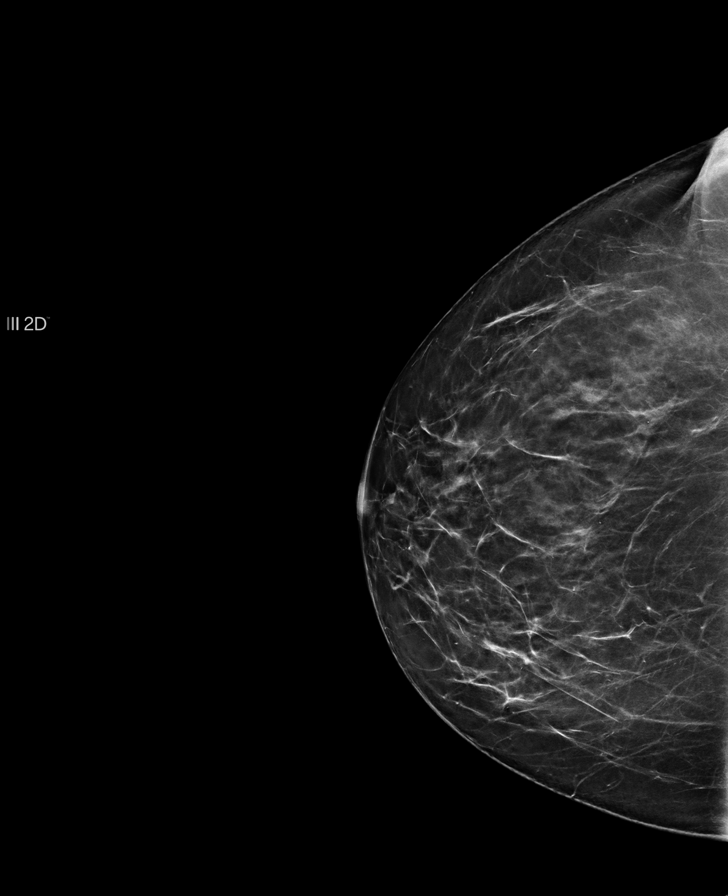

[R MLO tomo · tomo slice 35/70.0]
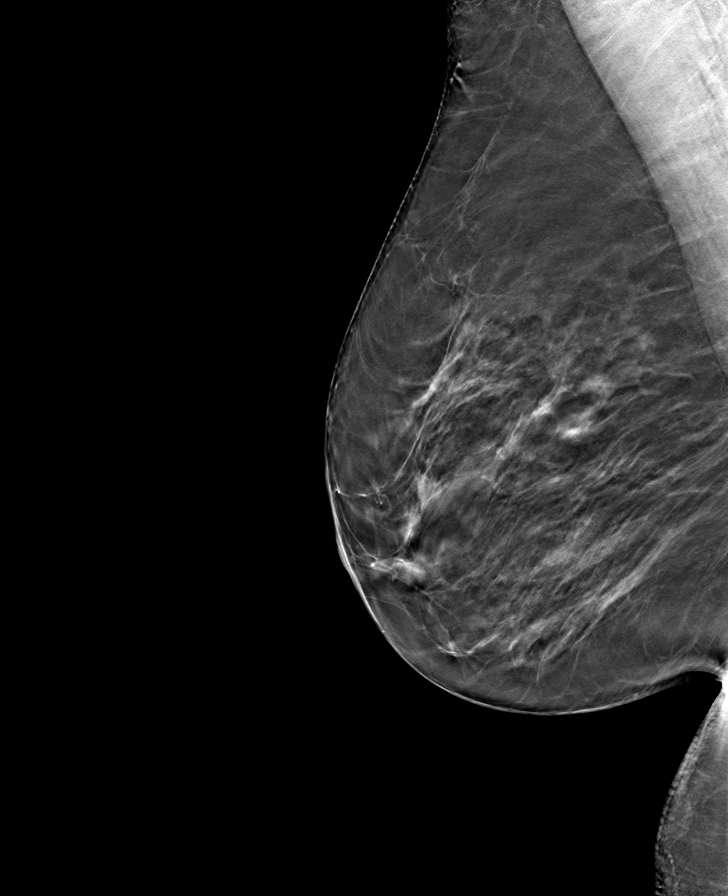

[L CC tomo · tomo slice 35/69.0]
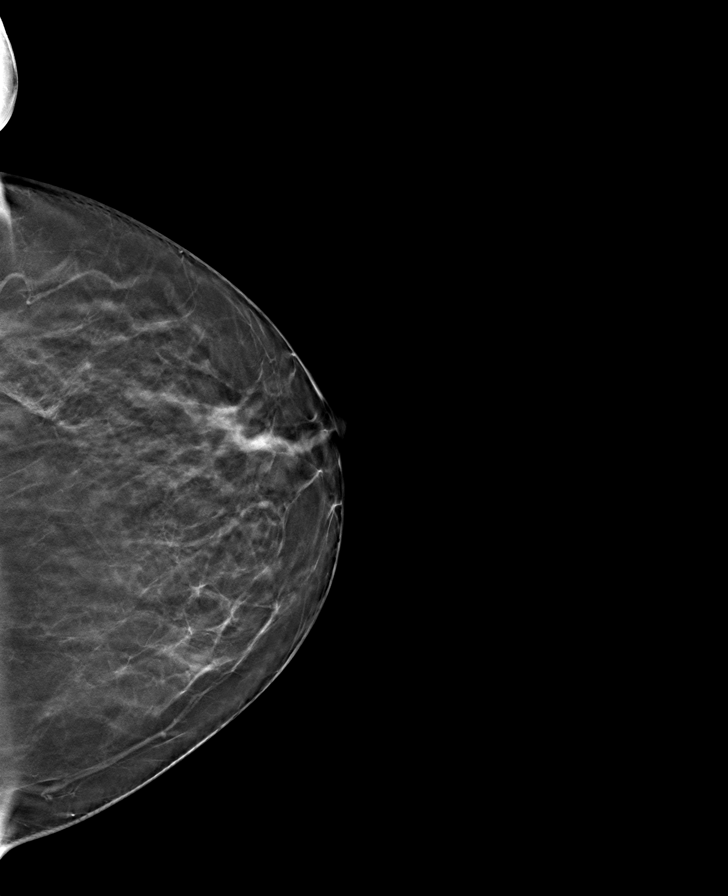

[R CC tomo · tomo slice 36/71.0]
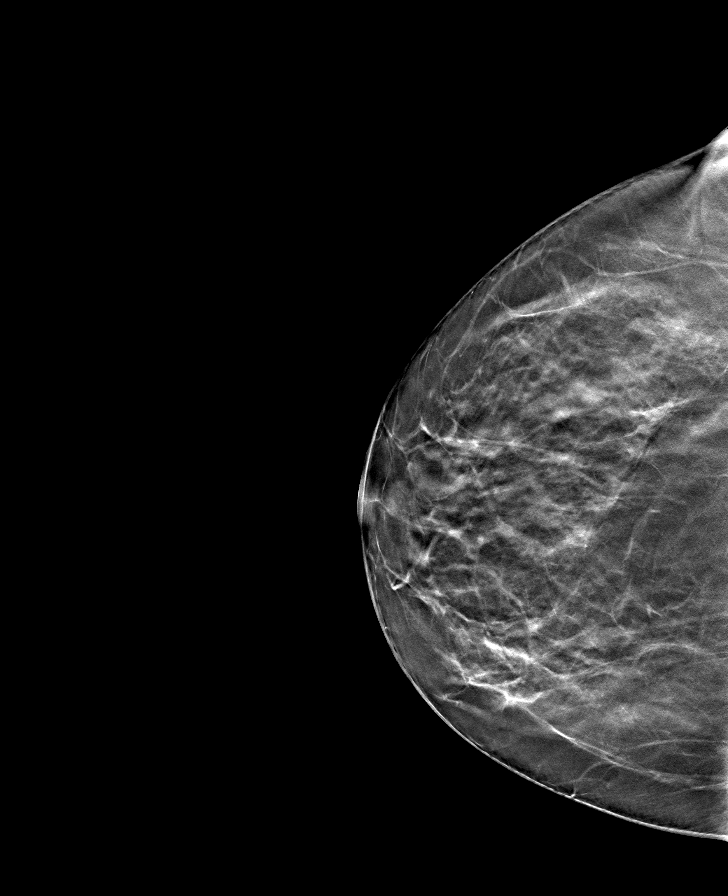

[L MLO tomo · tomo slice 36/71.0]
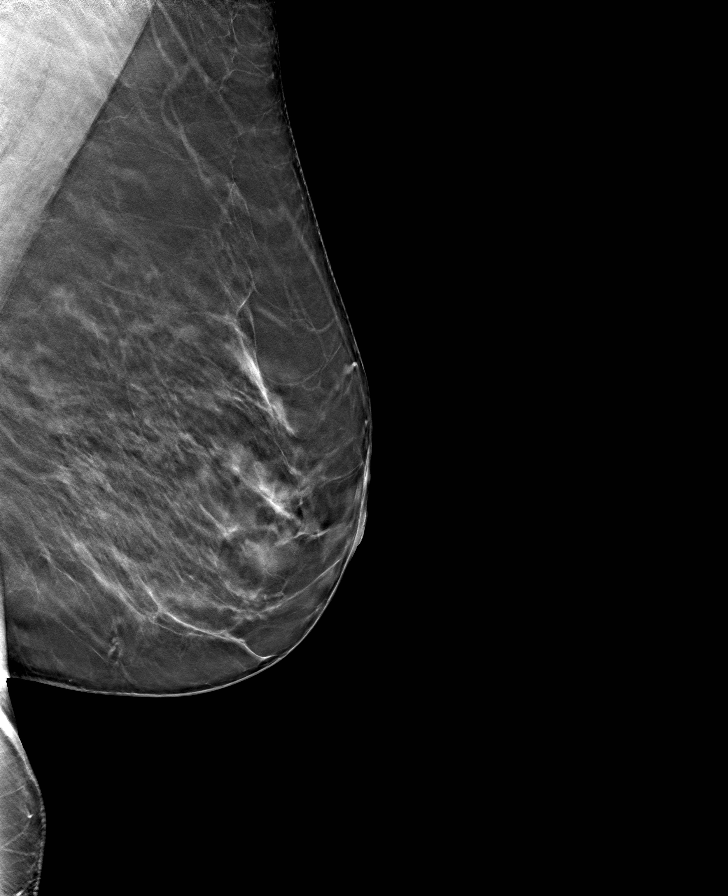

[8 of 24 positions shown; findings below may reference images not displayed]

FINDINGS: Increased density in the retroareolar region of the left breast 
possibly due to overlapping fibroglandular tissue. We will bring the patient for 
spot compression views of the left breast including caudal and mediolateral 
oblique projection. No new dominant mass in the right breast. No suspicious 
clusters of microcalcifications in either breast.
IMPRESSION: Increased density in the retroareolar region of the left breast possibly due to 
overlapping fibroglandular tissue. We will bring the patient for spot 
compression views of the left breast including caudal and mediolateral oblique 
projection.  
(BI-RADS 2) Benign findings. Routine mammographic follow-up is recommended.

## 2022-06-19 IMAGING — DX CERVICAL SPINE 4 VIEWS
1 series · 4 of 4 positions shown · non-contrast
Comparison: None

________________________________________________________________________________________________ 
CERVICAL SPINE 4 VIEWS, 06/19/2022 [DATE]: 
CLINICAL INDICATION: Neck pain, headache

[Series 1: lateral · U · 0.14mm/px · 4 of 4 slices shown]
[im 1/4]
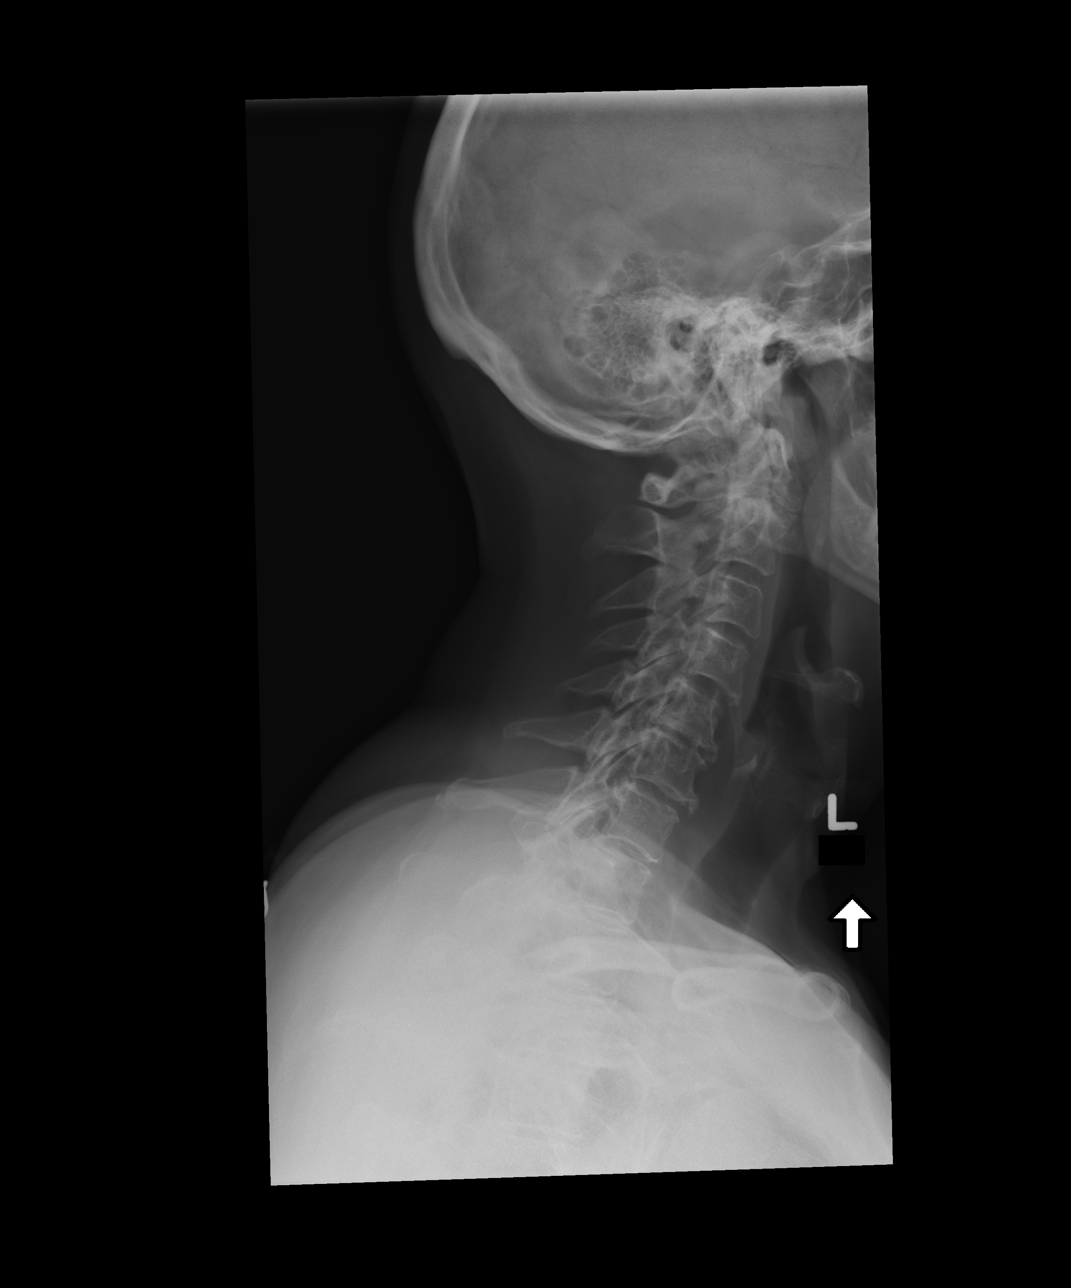
[im 2/4]
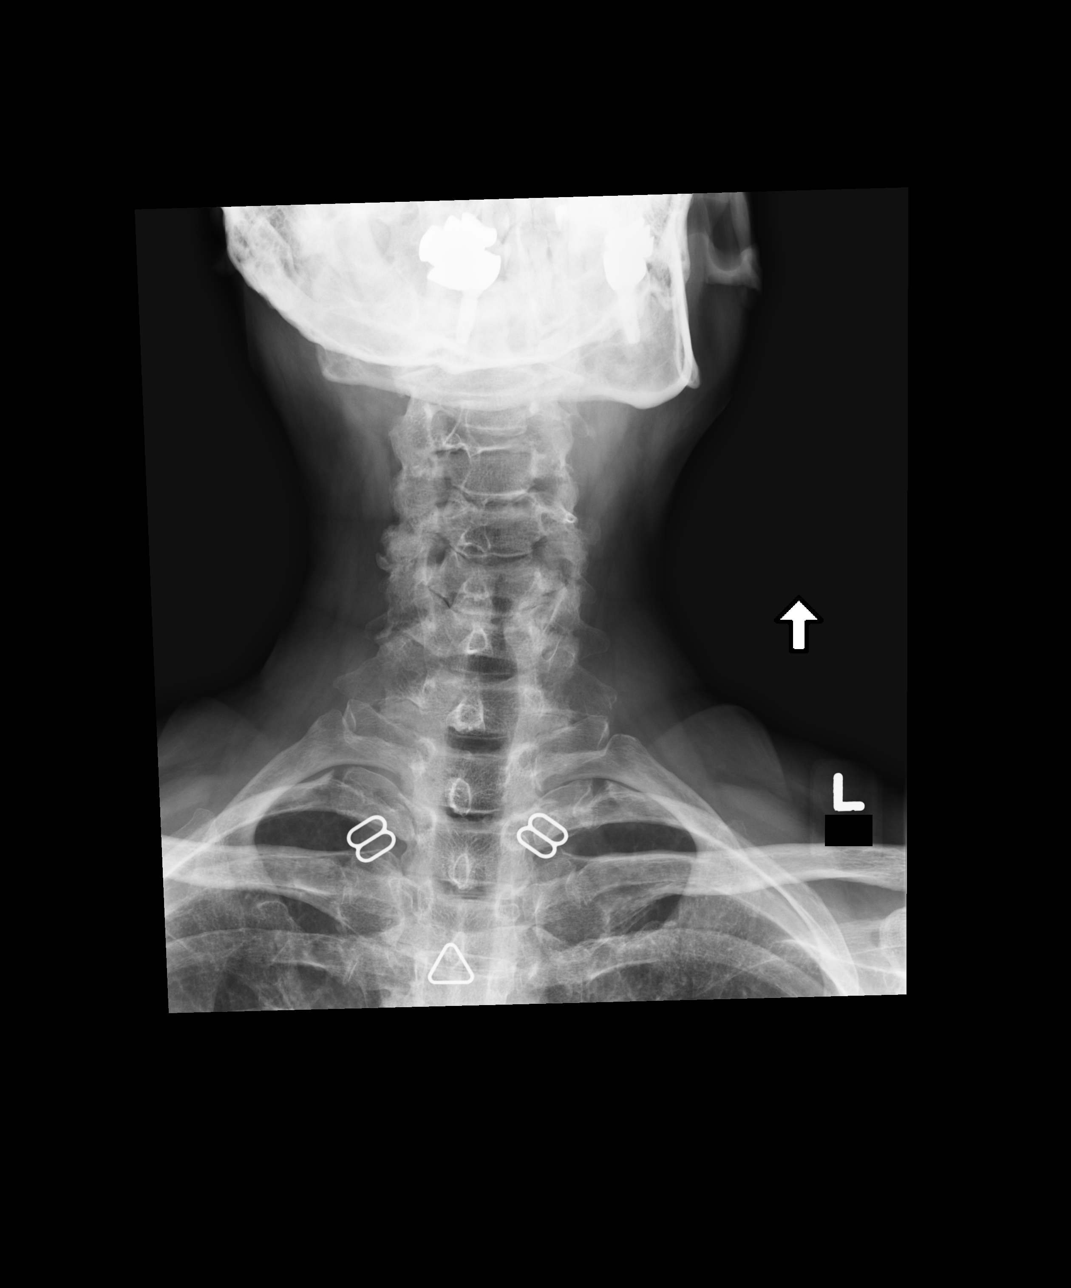
[im 3/4]
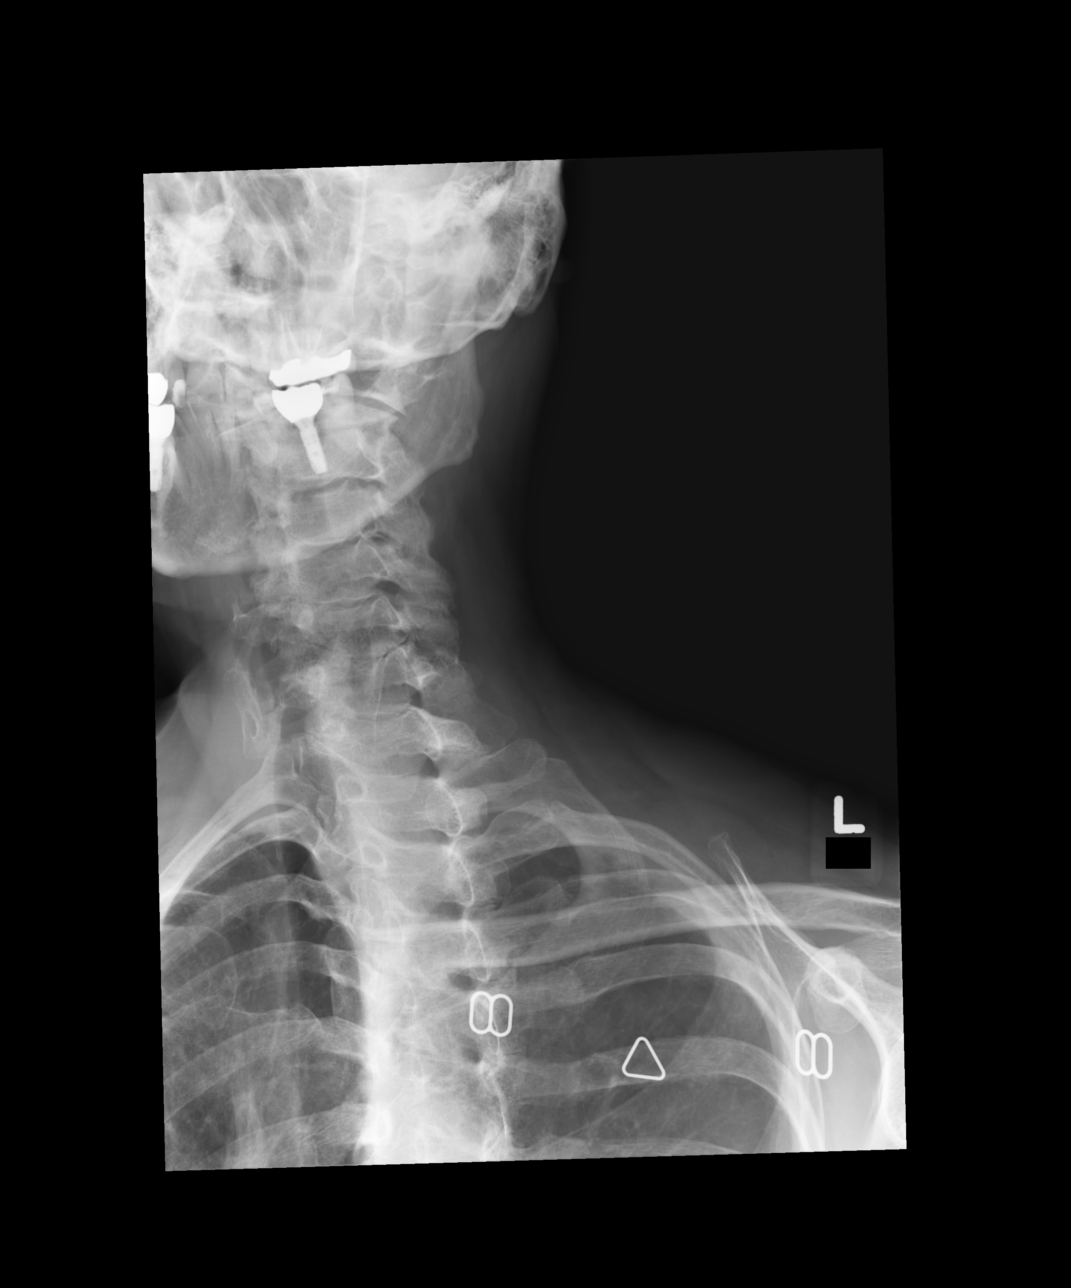
[im 4/4]
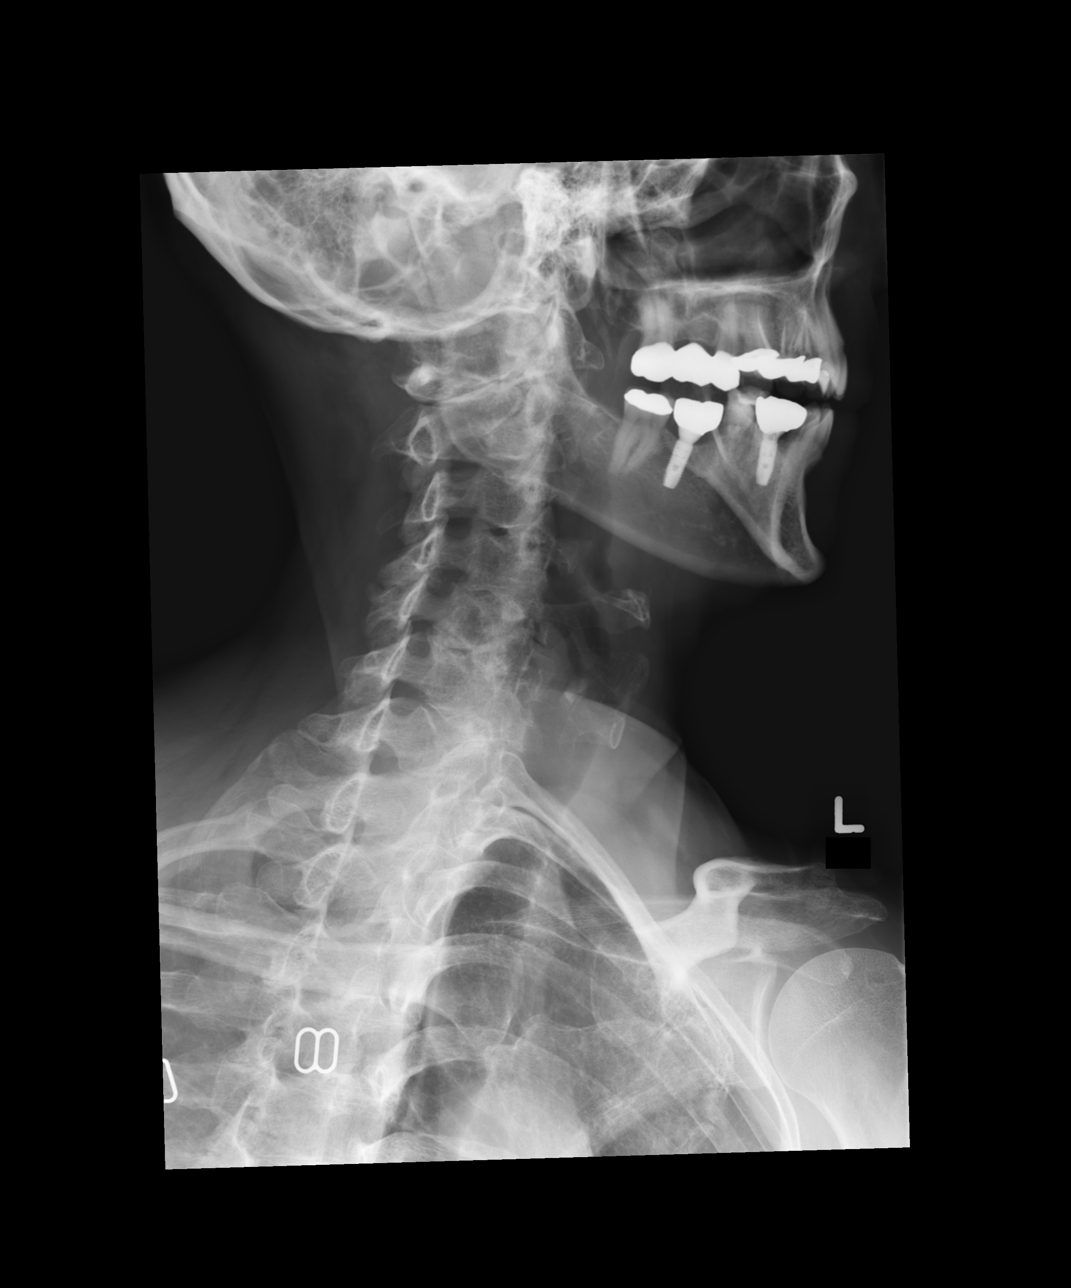

[4 of 4 positions shown; findings below may reference images not displayed]

FINDINGS: Cervical vertebral heights are intact. Lordosis is straightened. There 
is 1 mm anterolisthesis at C4-5. Marked disc narrowing at C5-6. The dens is 
intact. Views of the left foramina are limited due to improper positioning. 
Patient may return for repeat left oblique view at no additional charge. There 
is mild right foraminal stenosis at C5-6 due to uncovertebral spurring. Other 
right-sided foramina are open. There are zygapophyseal facet degenerative 
changes. There is no evidence for fracture or bone destruction.
IMPRESSION: Spondylosis. 1 mm anterolisthesis at C4-5. Straightened lordosis could indicate 
spasm. Please see comments above.

## 2022-12-21 IMAGING — MG MAMMOGRAPHY SCREENING BILATERAL 3[PERSON_NAME]
8 series · 8 of 24 positions shown · non-contrast
Comparison: 09/26/2021

________________________________________________________________________________________________ 
MAMMOGRAPHY SCREENING BILATERAL 3[facility]ARDT [facility], 12/21/2022 [DATE]: 
CLINICAL INDICATION: Encounter for screening mammogram.
TECHNIQUE: Digital bilateral mammograms and 3-D Tomosynthesis were obtained. 
These were interpreted both primarily and with the aid of computer-aided 
detection system.  
BREAST DENSITY: (Level B) There are scattered areas of fibroglandular density.

[R CC]
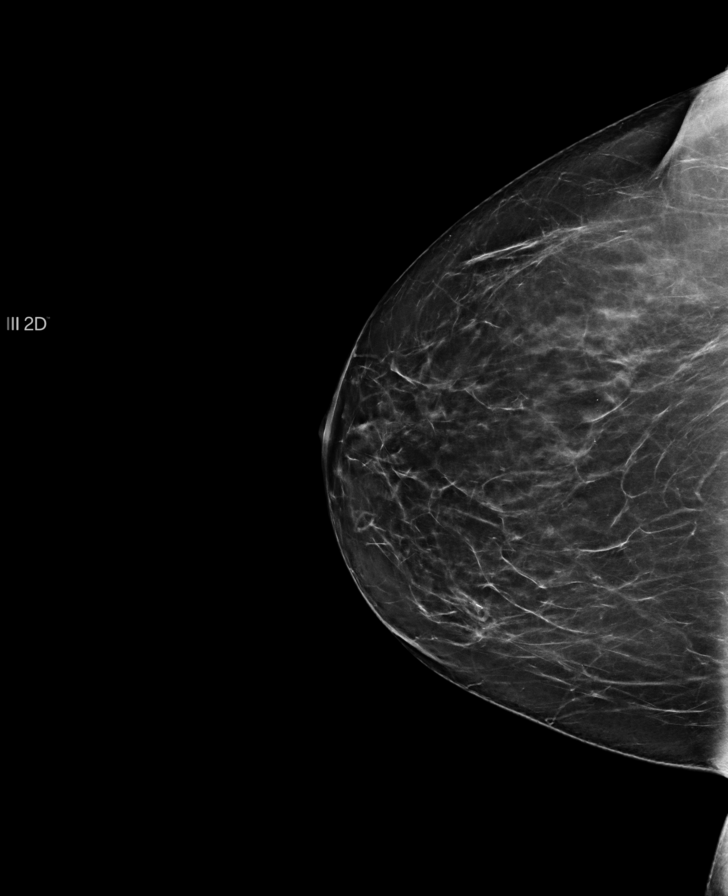

[L MLO]
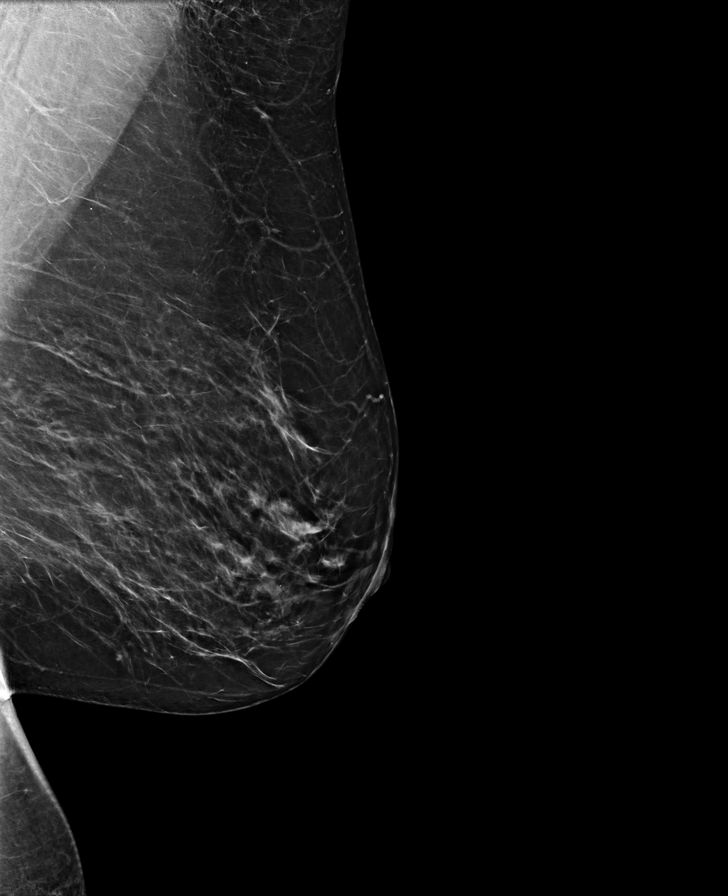

[R MLO]
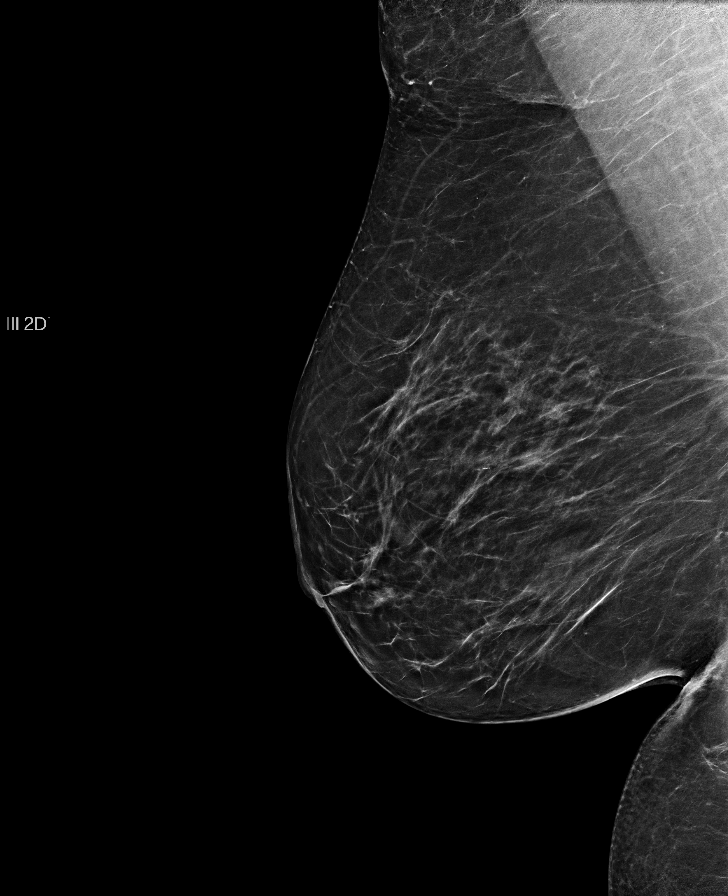

[L CC]
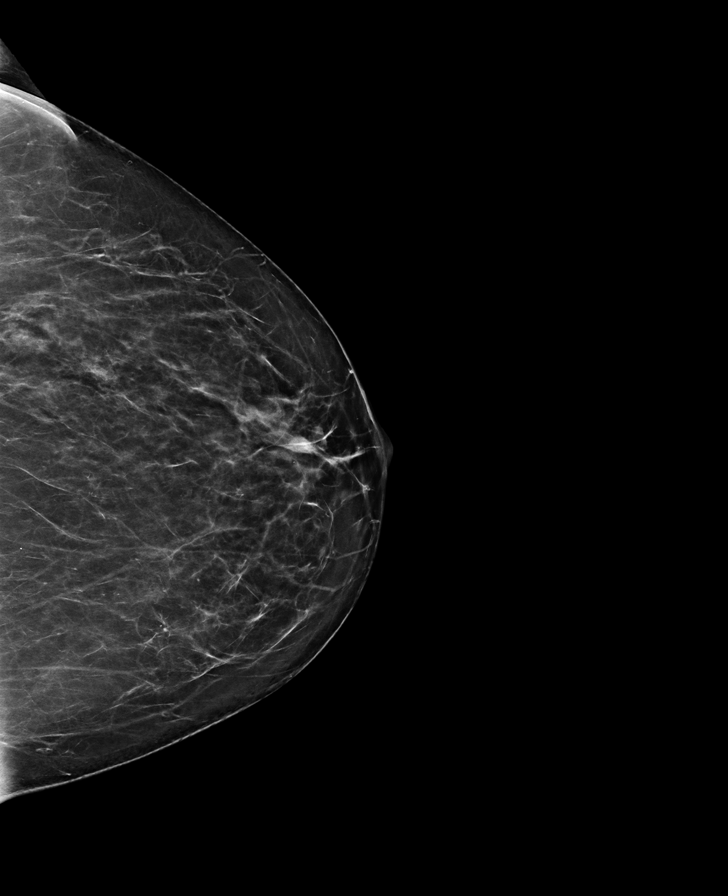

[R CC tomo · tomo slice 39/77.0]
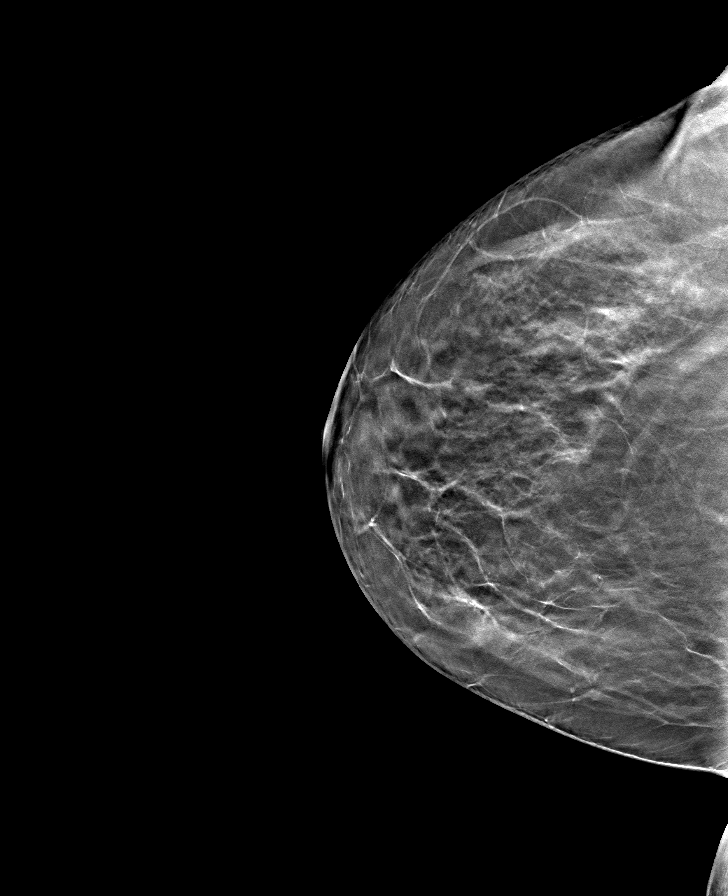

[L MLO tomo · tomo slice 40/79.0]
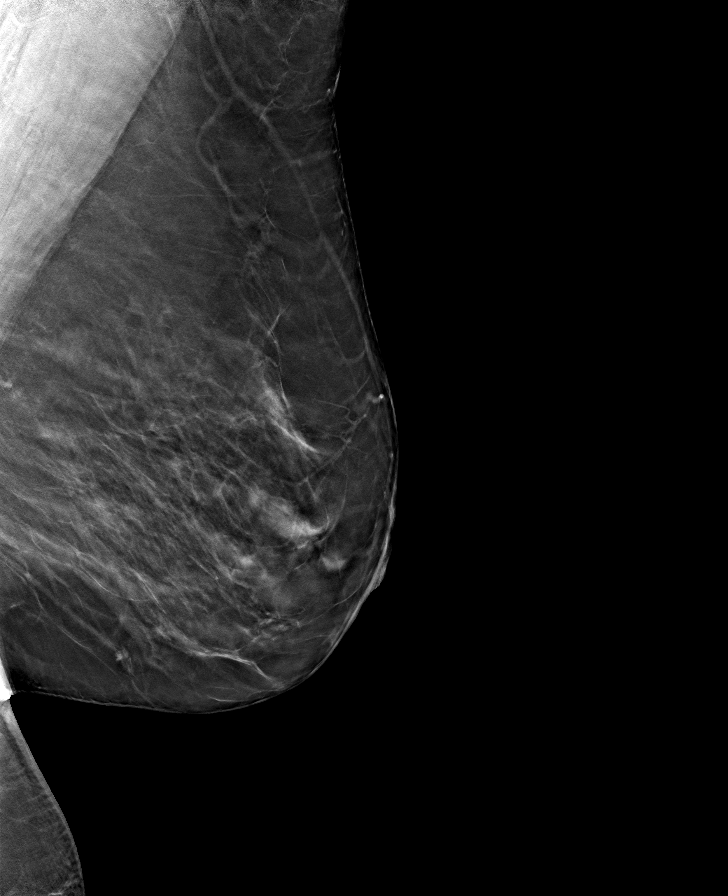

[R MLO tomo · tomo slice 41/80.0]
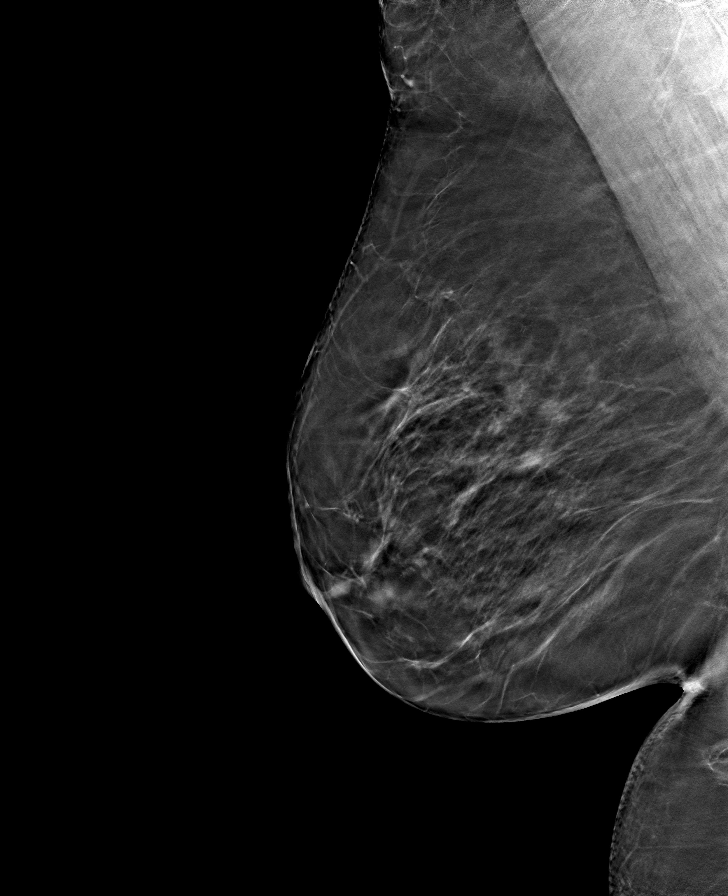

[L CC tomo · tomo slice 37/73.0]
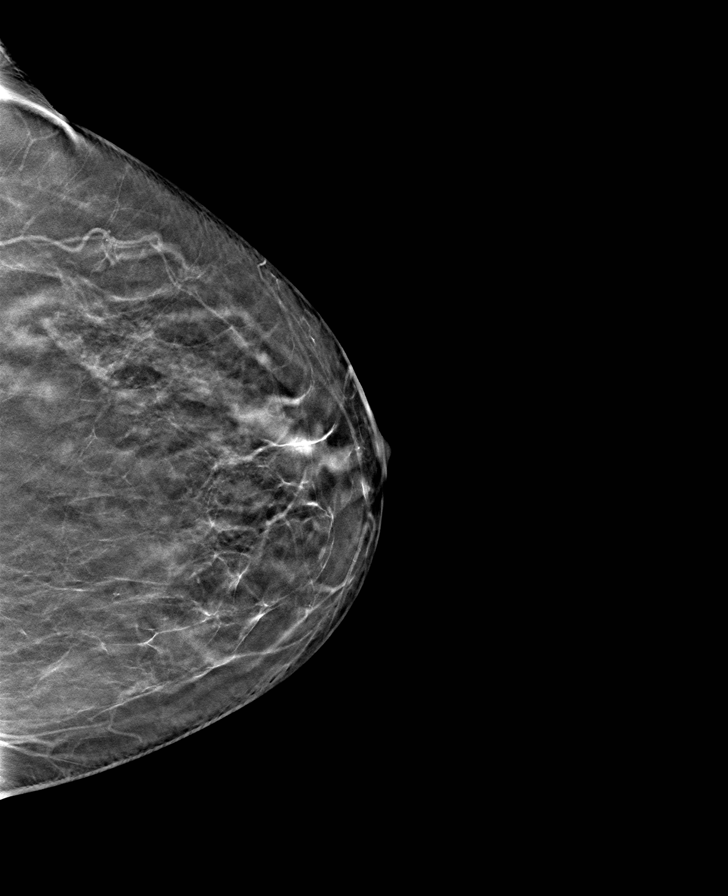

[8 of 24 positions shown; findings below may reference images not displayed]

FINDINGS: No suspicious mass, calcifications, or area of architectural 
distortion in either breast. The appearance of the retroareolar left breast has 
the appearance of normal fibroglandular parenchyma on todays exam.
IMPRESSION: No mammographic findings suggestive for malignancy. 
(BI-RADS 2) Benign findings. Routine mammographic follow-up is recommended.

## 2023-06-03 IMAGING — MR MRI LEFT HIP WITHOUT CONTRAST
4 of 6 series · 16 of 40 positions shown · IV contrast (gadolinium)
Comparison: None prior of the hip.

________________________________________________________________________________________________ 
MRI LEFT HIP WITHOUT CONTRAST, 06/03/2023 [DATE]: 
CLINICAL INDICATION: Unilateral primary osteoarthritis, left hip. Twisting 
golfing injury [DATE] months ago. Left hip and groin pain.
TECHNIQUE: Multiplanar, multiecho position MR images of the hip were performed 
without intravenous gadolinium enhancement. Large field-of-view images were 
performed of the pelvis to include the contralateral hip for comparison. Patient 
was scanned on a 3T magnet.

[Series 301: T1 · axial · 5.0mm · 0.60mm/px · z∈[-86,+128]mm · 3 of 44 slices shown]
[im 5/44]
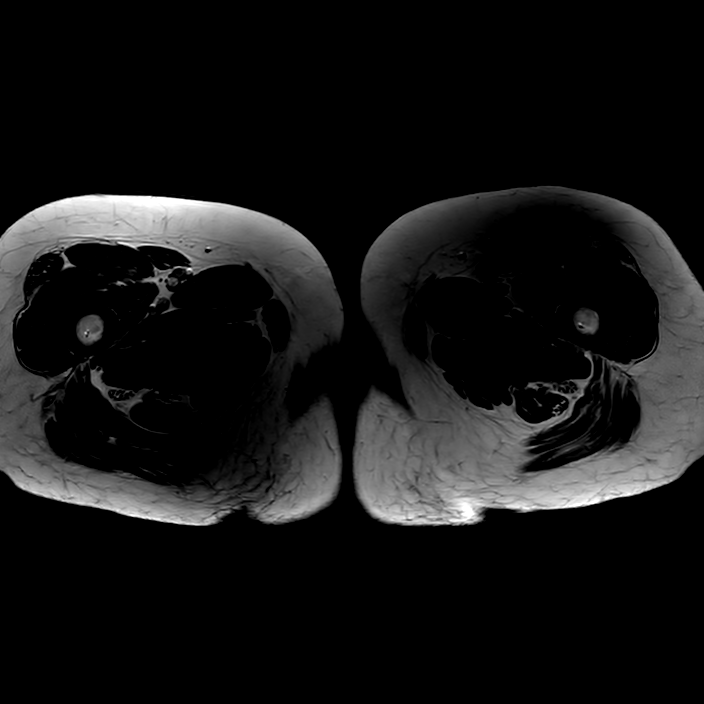
[im 24/44]
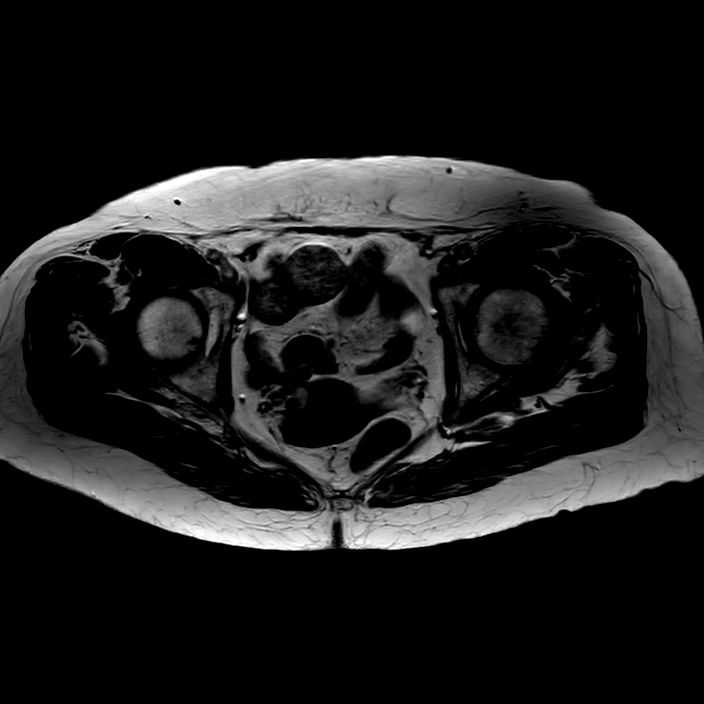
[im 39/44]
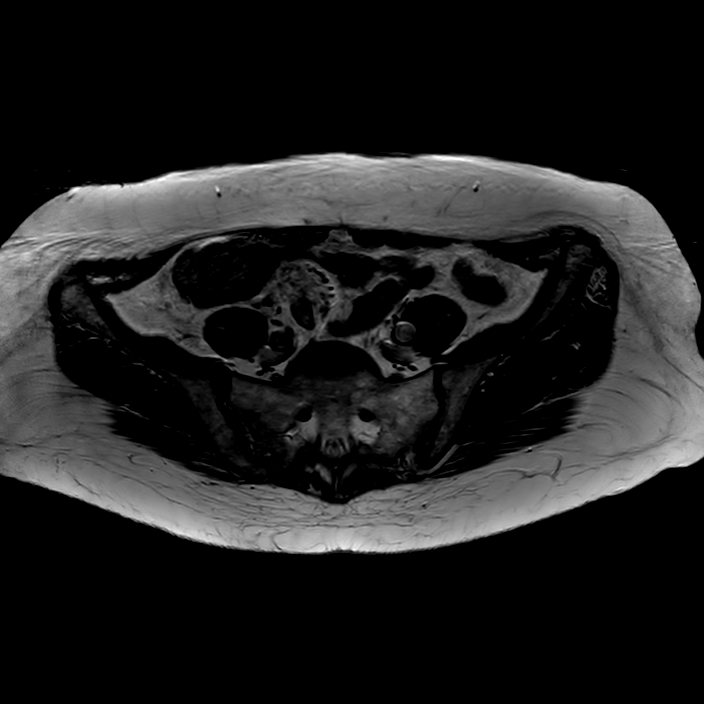

[Series 501: PD fat-sat · axial · 4.0mm · 0.35mm/px · z∈[-70,+73]mm · 7 of 32 slices shown (1 of 2)]
[im 1/32]
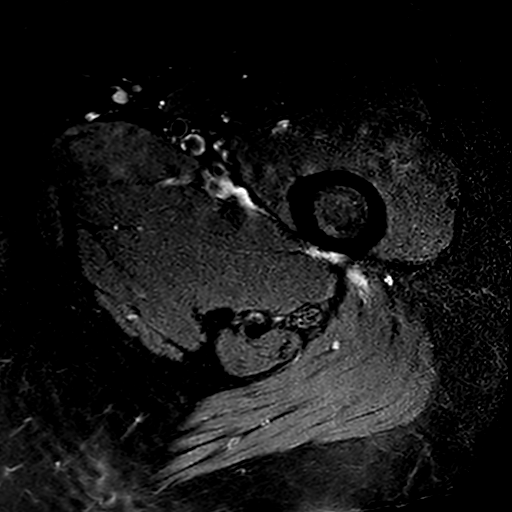
[im 6/32]
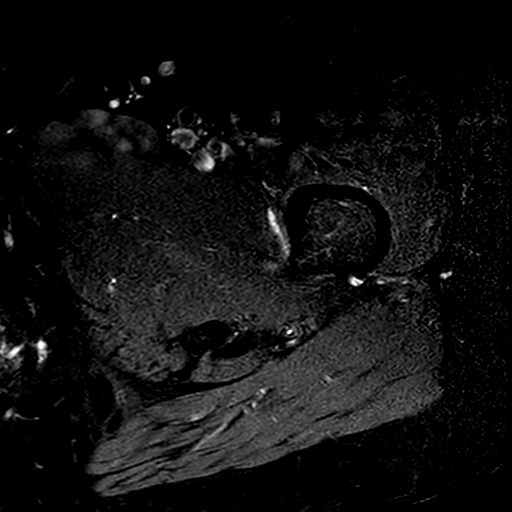
[im 11/32]
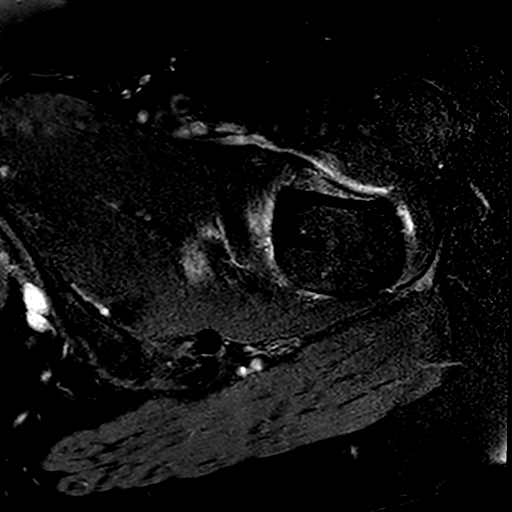
[im 16/32]
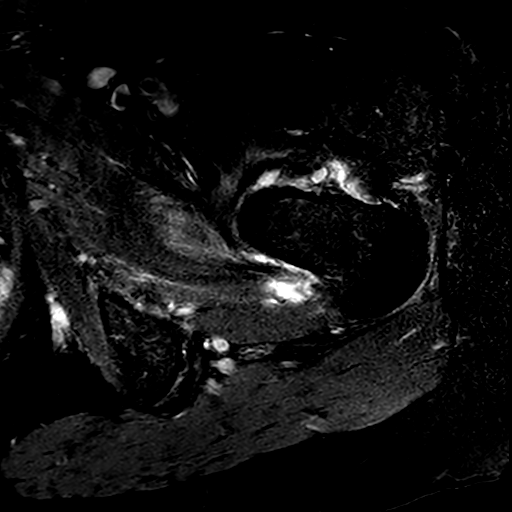
[im 21/32]
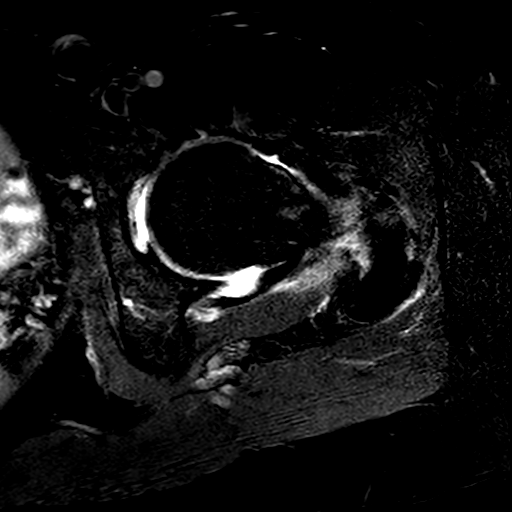
[im 26/32]
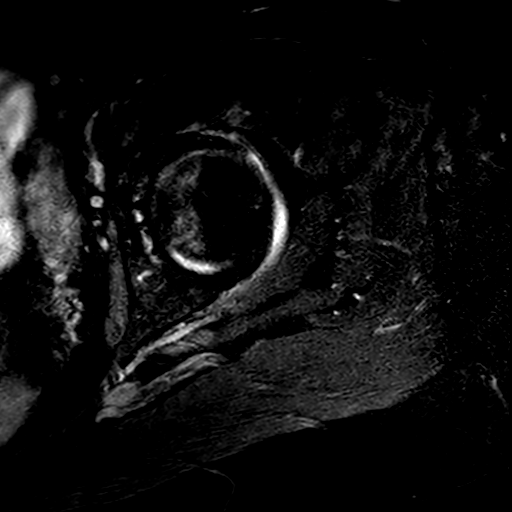
[im 32/32]
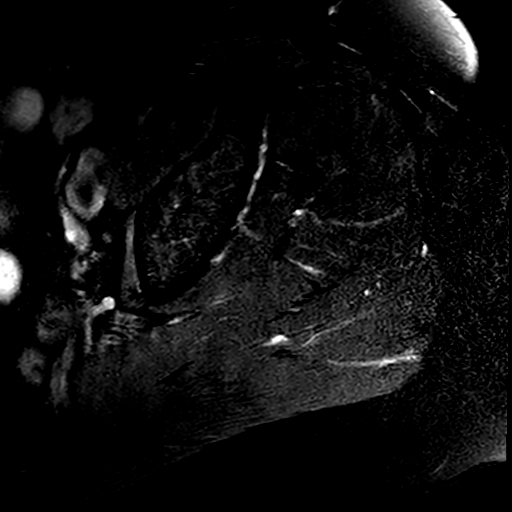

[Series 601: PD · sagittal · 3.5mm · 0.43mm/px · 3 of 36 slices shown]
[im 6/36]
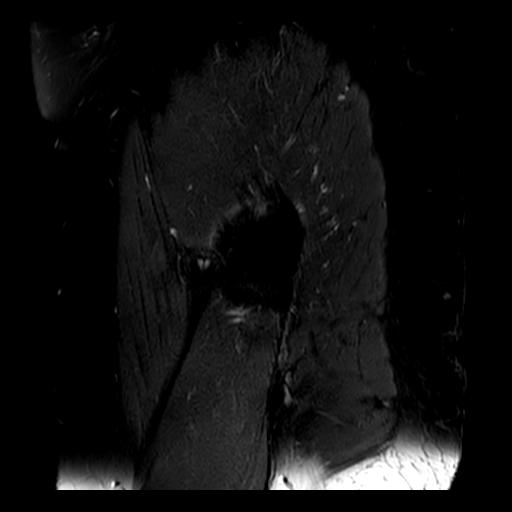
[im 21/36]
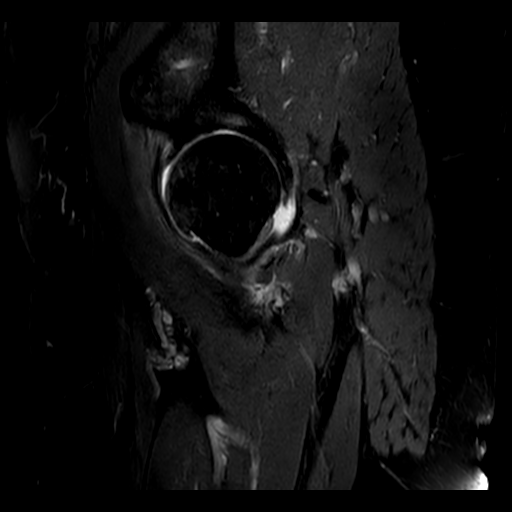
[im 31/36]
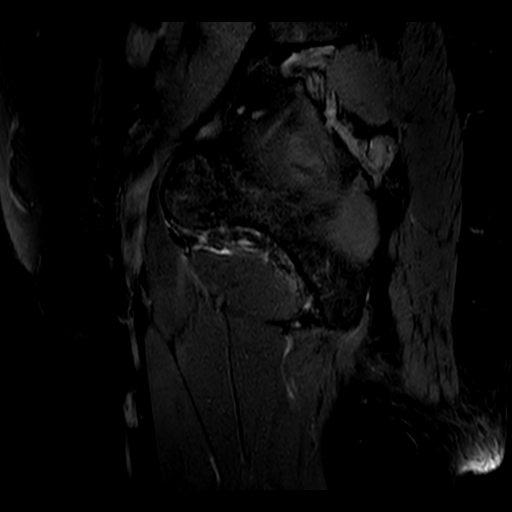

[Series 701: PD fat-sat · coronal · 3.2mm · 0.43mm/px · 3 of 21 slices shown (2 of 2)]
[im 1/21]
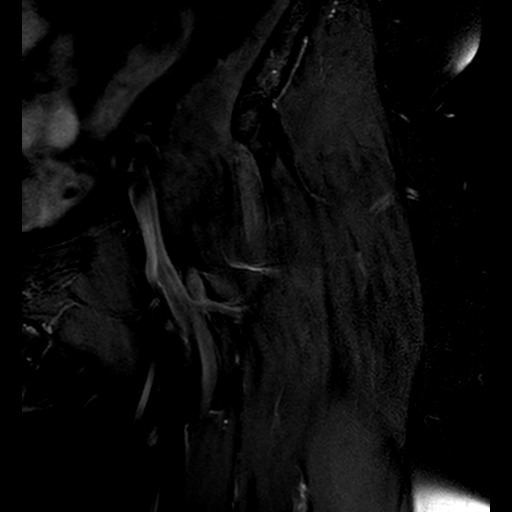
[im 11/21]
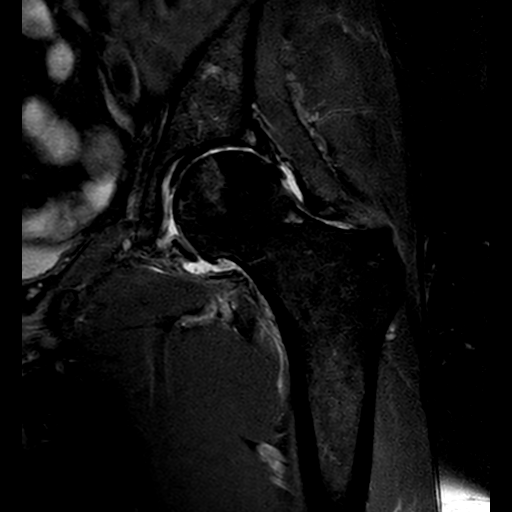
[im 21/21]
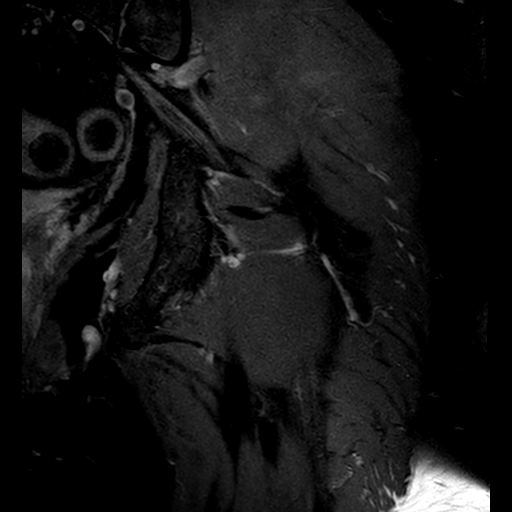

[16 of 40 positions shown; findings below may reference images not displayed]

FINDINGS: HIPS:  Advanced articular cartilaginous loss both hips, greater on the left. 
There is subcortical marrow edema involving the left femoral head and 
acetabulum. Small left hip effusion. Mild osteophytic spurring. Both femoral 
heads maintain a spherical configuration without evidence of avascular necrosis 
or subarticular collapse. No abnormal morphology of the proximal femurs or 
acetabula to predispose to impingement.  
BONES: Bone marrow signal intensity of the proximal femurs, pelvis, sacrum and 
lower lumbar spine is negative for fracture or pathologic marrow replacing 
lesion. Included lumbar spine demonstrates multilevel degenerative change. SI 
joints show mild degenerative change. 
SOFT TISSUES: The bilateral abductor cuffs are preserved. The insertions of the 
iliopsoas tendons are intact. The origins of the hamstrings are preserved. 
Rectus abdominis-adductor complex is preserved. The musculature is symmetric 
without strain, atrophy or mass. No focal fluid collection or distended bursa. 
Specifically, no iliopsoas or trochanteric bursitis. Included neurovascular 
bundles are negative. Intrapelvic contents are negative.
IMPRESSION: Degenerative changes including advanced involvement of the hips, greater on the 
left.
